# Patient Record
Sex: Female | Born: 1985 | Race: White | Hispanic: No | Marital: Married | Smoking: Current some day smoker
Health system: Southern US, Community
[De-identification: ages and names within clinical notes are randomized; demographics above are authoritative.]

## PROBLEM LIST (undated history)

## (undated) ENCOUNTER — Inpatient Hospital Stay (HOSPITAL_COMMUNITY): Payer: Self-pay

## (undated) DIAGNOSIS — R51 Headache: Secondary | ICD-10-CM

## (undated) DIAGNOSIS — E039 Hypothyroidism, unspecified: Secondary | ICD-10-CM

## (undated) DIAGNOSIS — I499 Cardiac arrhythmia, unspecified: Secondary | ICD-10-CM

## (undated) DIAGNOSIS — N83209 Unspecified ovarian cyst, unspecified side: Secondary | ICD-10-CM

## (undated) HISTORY — PX: DILATION AND CURETTAGE OF UTERUS: SHX78

## (undated) HISTORY — DX: Cardiac arrhythmia, unspecified: I49.9

## (undated) HISTORY — DX: Unspecified ovarian cyst, unspecified side: N83.209

## (undated) HISTORY — PX: TONSILLECTOMY: SUR1361

## (undated) HISTORY — DX: Hypothyroidism, unspecified: E03.9

## (undated) HISTORY — DX: Headache: R51

---

## 2006-06-29 ENCOUNTER — Encounter: Admission: RE | Admit: 2006-06-29 | Discharge: 2006-06-29 | Payer: Self-pay | Admitting: Orthopedic Surgery

## 2006-11-23 ENCOUNTER — Emergency Department (HOSPITAL_COMMUNITY): Admission: EM | Admit: 2006-11-23 | Discharge: 2006-11-23 | Payer: Self-pay | Admitting: Family Medicine

## 2010-06-15 ENCOUNTER — Ambulatory Visit (HOSPITAL_COMMUNITY): Admission: RE | Admit: 2010-06-15 | Discharge: 2010-06-15 | Payer: Self-pay | Admitting: Family Medicine

## 2012-06-24 LAB — OB RESULTS CONSOLE ANTIBODY SCREEN: Antibody Screen: POSITIVE

## 2012-06-24 LAB — OB RESULTS CONSOLE VARICELLA ZOSTER ANTIBODY, IGG: Varicella: IMMUNE

## 2012-06-24 LAB — OB RESULTS CONSOLE RUBELLA ANTIBODY, IGM: Rubella: IMMUNE

## 2012-06-24 LAB — OB RESULTS CONSOLE HEPATITIS B SURFACE ANTIGEN: Hepatitis B Surface Ag: NEGATIVE

## 2012-08-07 NOTE — L&D Delivery Note (Signed)
Delivery Note At 7:41 PM a viable female was delivered via Vaginal, Spontaneous Delivery (Presentation:oa ;  ).  APGAR:9 ,9 ; weight .   Placenta status:spont via shultz , .  Cord3 vc:  with the following complications:none .  Cord pH: n/a  Anesthesia: Epidural  Episiotomy: None Lacerations: 1st degree;Labial Suture Repair: n/a Est. Blood Loss (mL): 300  Mom to postpartum.  Baby to nursery-stable.  Wyvonnia Dusky DARLENE 02/23/2013, 7:58 PM

## 2012-08-07 NOTE — L&D Delivery Note (Signed)
Attestation of Attending Supervision of Advanced Practitioner (CNM/NP): Evaluation and management procedures were performed by the Advanced Practitioner under my supervision and collaboration.  I have reviewed the Advanced Practitioner's note and chart, and I agree with the management and plan.  Renesmae Donahey 02/27/2013 8:09 PM

## 2012-10-09 ENCOUNTER — Encounter: Payer: Self-pay | Admitting: *Deleted

## 2012-10-09 DIAGNOSIS — I499 Cardiac arrhythmia, unspecified: Secondary | ICD-10-CM

## 2012-10-09 DIAGNOSIS — G43909 Migraine, unspecified, not intractable, without status migrainosus: Secondary | ICD-10-CM | POA: Insufficient documentation

## 2012-10-09 DIAGNOSIS — E039 Hypothyroidism, unspecified: Secondary | ICD-10-CM | POA: Insufficient documentation

## 2012-10-22 ENCOUNTER — Ambulatory Visit (INDEPENDENT_AMBULATORY_CARE_PROVIDER_SITE_OTHER): Payer: Medicaid Other | Admitting: Advanced Practice Midwife

## 2012-10-22 ENCOUNTER — Encounter: Payer: Self-pay | Admitting: Advanced Practice Midwife

## 2012-10-22 ENCOUNTER — Other Ambulatory Visit: Payer: Self-pay | Admitting: Advanced Practice Midwife

## 2012-10-22 VITALS — BP 110/82 | Wt 260.0 lb

## 2012-10-22 DIAGNOSIS — F1920 Other psychoactive substance dependence, uncomplicated: Secondary | ICD-10-CM

## 2012-10-22 DIAGNOSIS — O09299 Supervision of pregnancy with other poor reproductive or obstetric history, unspecified trimester: Secondary | ICD-10-CM

## 2012-10-22 DIAGNOSIS — O99321 Drug use complicating pregnancy, first trimester: Secondary | ICD-10-CM

## 2012-10-22 DIAGNOSIS — Z3492 Encounter for supervision of normal pregnancy, unspecified, second trimester: Secondary | ICD-10-CM

## 2012-10-22 DIAGNOSIS — F1910 Other psychoactive substance abuse, uncomplicated: Secondary | ICD-10-CM

## 2012-10-22 DIAGNOSIS — O99320 Drug use complicating pregnancy, unspecified trimester: Secondary | ICD-10-CM

## 2012-10-22 DIAGNOSIS — Z331 Pregnant state, incidental: Secondary | ICD-10-CM

## 2012-10-22 DIAGNOSIS — Z1389 Encounter for screening for other disorder: Secondary | ICD-10-CM

## 2012-10-22 DIAGNOSIS — IMO0002 Reserved for concepts with insufficient information to code with codable children: Secondary | ICD-10-CM

## 2012-10-22 LAB — POCT URINALYSIS DIPSTICK: Glucose, UA: NEGATIVE

## 2012-10-22 NOTE — Progress Notes (Signed)
No c/o at this time.  Routine questions about pregnancy andswered.  F/U in 4 weeks for PNV and 2 hour gtt.

## 2012-10-22 NOTE — Progress Notes (Signed)
Swelling in hands and feet. 

## 2012-10-23 LAB — DRUG SCREEN, URINE, NO CONFIRMATION
Amphetamine Screen, Ur: NEGATIVE
Benzodiazepines.: NEGATIVE
Cocaine Metabolites: NEGATIVE
Marijuana Metabolite: NEGATIVE
Opiate Screen, Urine: NEGATIVE
Phencyclidine (PCP): NEGATIVE

## 2012-10-24 ENCOUNTER — Telehealth: Payer: Self-pay | Admitting: Obstetrics and Gynecology

## 2012-10-24 NOTE — Telephone Encounter (Signed)
Pt informed to push fluids and can take OTC Tylenol for the cramping. Pt told to call office back if no improvements. Pt verbalized understanding.

## 2012-11-21 ENCOUNTER — Other Ambulatory Visit: Payer: Medicaid Other

## 2012-11-21 ENCOUNTER — Encounter: Payer: Self-pay | Admitting: Advanced Practice Midwife

## 2012-11-21 ENCOUNTER — Ambulatory Visit (INDEPENDENT_AMBULATORY_CARE_PROVIDER_SITE_OTHER): Payer: Medicaid Other | Admitting: Advanced Practice Midwife

## 2012-11-21 VITALS — BP 110/80 | Wt 268.0 lb

## 2012-11-21 DIAGNOSIS — Z1389 Encounter for screening for other disorder: Secondary | ICD-10-CM

## 2012-11-21 DIAGNOSIS — IMO0002 Reserved for concepts with insufficient information to code with codable children: Secondary | ICD-10-CM

## 2012-11-21 DIAGNOSIS — Z3480 Encounter for supervision of other normal pregnancy, unspecified trimester: Secondary | ICD-10-CM | POA: Insufficient documentation

## 2012-11-21 DIAGNOSIS — Z3482 Encounter for supervision of other normal pregnancy, second trimester: Secondary | ICD-10-CM

## 2012-11-21 DIAGNOSIS — O09299 Supervision of pregnancy with other poor reproductive or obstetric history, unspecified trimester: Secondary | ICD-10-CM

## 2012-11-21 DIAGNOSIS — Z331 Pregnant state, incidental: Secondary | ICD-10-CM

## 2012-11-21 DIAGNOSIS — E079 Disorder of thyroid, unspecified: Secondary | ICD-10-CM

## 2012-11-21 DIAGNOSIS — E039 Hypothyroidism, unspecified: Secondary | ICD-10-CM

## 2012-11-21 DIAGNOSIS — F1920 Other psychoactive substance dependence, uncomplicated: Secondary | ICD-10-CM

## 2012-11-21 LAB — POCT URINALYSIS DIPSTICK
Leukocytes, UA: NEGATIVE
Nitrite, UA: NEGATIVE
Protein, UA: NEGATIVE

## 2012-11-21 LAB — CBC
HCT: 35 % — ABNORMAL LOW (ref 36.0–46.0)
Hemoglobin: 11.9 g/dL — ABNORMAL LOW (ref 12.0–15.0)
MCH: 31.1 pg (ref 26.0–34.0)
MCV: 91.4 fL (ref 78.0–100.0)
Platelets: 205 10*3/uL (ref 150–400)
RBC: 3.83 MIL/uL — ABNORMAL LOW (ref 3.87–5.11)
WBC: 8 10*3/uL (ref 4.0–10.5)

## 2012-11-21 NOTE — Progress Notes (Addendum)
Error

## 2012-11-21 NOTE — Progress Notes (Signed)
C/O URI symptoms for 3 days. OTC meds/ call for ABX if symptoms still present in 1 week.    No c/o at this time. Went to class.  May get Doula.   Routine questions about pregnancy answered.  F/U in 4 weeks for LROB.

## 2012-11-21 NOTE — Progress Notes (Signed)
Pain in the bottom of stomach.

## 2012-11-22 LAB — GLUCOSE TOLERANCE, 2 HOURS W/ 1HR
Glucose, 1 hour: 123 mg/dL (ref 70–170)
Glucose, 2 hour: 121 mg/dL (ref 70–139)
Glucose, Fasting: 90 mg/dL (ref 70–99)

## 2012-11-22 LAB — T.PALLIDUM AB, TOTAL: T pallidum Antibodies (TP-PA): 0.14 S/CO (ref <0.90)

## 2012-11-22 LAB — ANTIBODY SCREEN

## 2012-12-06 ENCOUNTER — Telehealth: Payer: Self-pay | Admitting: Obstetrics & Gynecology

## 2012-12-06 ENCOUNTER — Ambulatory Visit (INDEPENDENT_AMBULATORY_CARE_PROVIDER_SITE_OTHER): Payer: Medicaid Other | Admitting: Obstetrics & Gynecology

## 2012-12-06 ENCOUNTER — Encounter: Payer: Self-pay | Admitting: Obstetrics & Gynecology

## 2012-12-06 VITALS — BP 116/80 | Wt 267.5 lb

## 2012-12-06 DIAGNOSIS — Z331 Pregnant state, incidental: Secondary | ICD-10-CM

## 2012-12-06 DIAGNOSIS — Z1389 Encounter for screening for other disorder: Secondary | ICD-10-CM

## 2012-12-06 DIAGNOSIS — O99019 Anemia complicating pregnancy, unspecified trimester: Secondary | ICD-10-CM

## 2012-12-06 DIAGNOSIS — F1920 Other psychoactive substance dependence, uncomplicated: Secondary | ICD-10-CM

## 2012-12-06 DIAGNOSIS — O09299 Supervision of pregnancy with other poor reproductive or obstetric history, unspecified trimester: Secondary | ICD-10-CM

## 2012-12-06 DIAGNOSIS — O99280 Endocrine, nutritional and metabolic diseases complicating pregnancy, unspecified trimester: Secondary | ICD-10-CM

## 2012-12-06 DIAGNOSIS — O26899 Other specified pregnancy related conditions, unspecified trimester: Secondary | ICD-10-CM

## 2012-12-06 LAB — POCT URINALYSIS DIPSTICK
Glucose, UA: NEGATIVE
Ketones, UA: NEGATIVE
Leukocytes, UA: NEGATIVE
Nitrite, UA: NEGATIVE

## 2012-12-06 NOTE — Telephone Encounter (Signed)
Pt advised by Gigi Gin to come in to be seen at 12:30.

## 2012-12-06 NOTE — Progress Notes (Signed)
Cramps and pressure. Started Wednesday pm.

## 2012-12-06 NOTE — Progress Notes (Signed)
Called in complaining of intermittent cramping since Wednesday, no bleeding no gushes of fluid, not severe. Exam is negative with a long thick closed and firm cervix.  Pt reports someSome constipation, so either normal pregnancy discomfort/round ligament or GI Recommend miralax powder if constipation worsens

## 2012-12-19 ENCOUNTER — Encounter: Payer: Medicaid Other | Admitting: Advanced Practice Midwife

## 2012-12-23 ENCOUNTER — Ambulatory Visit (INDEPENDENT_AMBULATORY_CARE_PROVIDER_SITE_OTHER): Payer: Medicaid Other | Admitting: Women's Health

## 2012-12-23 ENCOUNTER — Encounter: Payer: Self-pay | Admitting: Women's Health

## 2012-12-23 VITALS — BP 110/80 | Wt 265.5 lb

## 2012-12-23 DIAGNOSIS — E079 Disorder of thyroid, unspecified: Secondary | ICD-10-CM

## 2012-12-23 DIAGNOSIS — O09299 Supervision of pregnancy with other poor reproductive or obstetric history, unspecified trimester: Secondary | ICD-10-CM

## 2012-12-23 DIAGNOSIS — F1920 Other psychoactive substance dependence, uncomplicated: Secondary | ICD-10-CM

## 2012-12-23 DIAGNOSIS — Z331 Pregnant state, incidental: Secondary | ICD-10-CM

## 2012-12-23 DIAGNOSIS — O99320 Drug use complicating pregnancy, unspecified trimester: Secondary | ICD-10-CM

## 2012-12-23 DIAGNOSIS — Z1389 Encounter for screening for other disorder: Secondary | ICD-10-CM

## 2012-12-23 DIAGNOSIS — O99280 Endocrine, nutritional and metabolic diseases complicating pregnancy, unspecified trimester: Secondary | ICD-10-CM

## 2012-12-23 DIAGNOSIS — O99322 Drug use complicating pregnancy, second trimester: Secondary | ICD-10-CM

## 2012-12-23 DIAGNOSIS — Z3483 Encounter for supervision of other normal pregnancy, third trimester: Secondary | ICD-10-CM

## 2012-12-23 DIAGNOSIS — E039 Hypothyroidism, unspecified: Secondary | ICD-10-CM

## 2012-12-23 DIAGNOSIS — O99019 Anemia complicating pregnancy, unspecified trimester: Secondary | ICD-10-CM

## 2012-12-23 LAB — POCT URINALYSIS DIPSTICK
Glucose, UA: NEGATIVE
Leukocytes, UA: NEGATIVE
Nitrite, UA: NEGATIVE

## 2012-12-23 MED ORDER — PRENATAL PLUS 27-1 MG PO TABS: 1.0000 | ORAL_TABLET | Freq: Every day | ORAL | Status: DC

## 2012-12-23 NOTE — Patient Instructions (Signed)
Pregnancy - Third Trimester  The third trimester of pregnancy (the last 3 months) is a period of the most rapid growth for you and your baby. The baby approaches a length of 20 inches and a weight of 6 to 10 pounds. The baby is adding on fat and getting ready for life outside your body. While inside, babies have periods of sleeping and waking, suck their thumbs, and hiccups. You can often feel small contractions of the uterus. This is false labor. It is also called Braxton-Hicks contractions. This is like a practice for labor. The usual problems in this stage of pregnancy include more difficulty breathing, swelling of the hands and feet from water retention, and having to urinate more often because of the uterus and baby pressing on your bladder.   PRENATAL EXAMS  · Blood work may continue to be done during prenatal exams. These tests are done to check on your health and the probable health of your baby. Blood work is used to follow your blood levels (hemoglobin). Anemia (low hemoglobin) is common during pregnancy. Iron and vitamins are given to help prevent this. You may also continue to be checked for diabetes. Some of the past blood tests may be done again.  · The size of the uterus is measured during each visit. This makes sure your baby is growing properly according to your pregnancy dates.  · Your blood pressure is checked every prenatal visit. This is to make sure you are not getting toxemia.  · Your urine is checked every prenatal visit for infection, diabetes and protein.  · Your weight is checked at each visit. This is done to make sure gains are happening at the suggested rate and that you and your baby are growing normally.  · Sometimes, an ultrasound is performed to confirm the position and the proper growth and development of the baby. This is a test done that bounces harmless sound waves off the baby so your caregiver can more accurately determine due dates.  · Discuss the type of pain medication and  anesthesia you will have during your labor and delivery.  · Discuss the possibility and anesthesia if a Cesarean Section might be necessary.  · Inform your caregiver if there is any mental or physical violence at home.  Sometimes, a specialized non-stress test, contraction stress test and biophysical profile are done to make sure the baby is not having a problem. Checking the amniotic fluid surrounding the baby is called an amniocentesis. The amniotic fluid is removed by sticking a needle into the belly (abdomen). This is sometimes done near the end of pregnancy if an early delivery is required. In this case, it is done to help make sure the baby's lungs are mature enough for the baby to live outside of the womb. If the lungs are not mature and it is unsafe to deliver the baby, an injection of cortisone medication is given to the mother 1 to 2 days before the delivery. This helps the baby's lungs mature and makes it safer to deliver the baby.  CHANGES OCCURING IN THE THIRD TRIMESTER OF PREGNANCY  Your body goes through many changes during pregnancy. They vary from person to person. Talk to your caregiver about changes you notice and are concerned about.  · During the last trimester, you have probably had an increase in your appetite. It is normal to have cravings for certain foods. This varies from person to person and pregnancy to pregnancy.  · You may begin to   get stretch marks on your hips, abdomen, and breasts. These are normal changes in the body during pregnancy. There are no exercises or medications to take which prevent this change.  · Constipation may be treated with a stool softener or adding bulk to your diet. Drinking lots of fluids, fiber in vegetables, fruits, and whole grains are helpful.  · Exercising is also helpful. If you have been very active up until your pregnancy, most of these activities can be continued during your pregnancy. If you have been less active, it is helpful to start an exercise  program such as walking. Consult your caregiver before starting exercise programs.  · Avoid all smoking, alcohol, un-prescribed drugs, herbs and "street drugs" during your pregnancy. These chemicals affect the formation and growth of the baby. Avoid chemicals throughout the pregnancy to ensure the delivery of a healthy infant.  · Backache, varicose veins and hemorrhoids may develop or get worse.  · You will tire more easily in the third trimester, which is normal.  · The baby's movements may be stronger and more often.  · You may become short of breath easily.  · Your belly button may stick out.  · A yellow discharge may leak from your breasts called colostrum.  · You may have a bloody mucus discharge. This usually occurs a few days to a week before labor begins.  HOME CARE INSTRUCTIONS   · Keep your caregiver's appointments. Follow your caregiver's instructions regarding medication use, exercise, and diet.  · During pregnancy, you are providing food for you and your baby. Continue to eat regular, well-balanced meals. Choose foods such as meat, fish, milk and other low fat dairy products, vegetables, fruits, and whole-grain breads and cereals. Your caregiver will tell you of the ideal weight gain.  · A physical sexual relationship may be continued throughout pregnancy if there are no other problems such as early (premature) leaking of amniotic fluid from the membranes, vaginal bleeding, or belly (abdominal) pain.  · Exercise regularly if there are no restrictions. Check with your caregiver if you are unsure of the safety of your exercises. Greater weight gain will occur in the last 2 trimesters of pregnancy. Exercising helps:  · Control your weight.  · Get you in shape for labor and delivery.  · You lose weight after you deliver.  · Rest a lot with legs elevated, or as needed for leg cramps or low back pain.  · Wear a good support or jogging bra for breast tenderness during pregnancy. This may help if worn during  sleep. Pads or tissues may be used in the bra if you are leaking colostrum.  · Do not use hot tubs, steam rooms, or saunas.  · Wear your seat belt when driving. This protects you and your baby if you are in an accident.  · Avoid raw meat, cat litter boxes and soil used by cats. These carry germs that can cause birth defects in the baby.  · It is easier to loose urine during pregnancy. Tightening up and strengthening the pelvic muscles will help with this problem. You can practice stopping your urination while you are going to the bathroom. These are the same muscles you need to strengthen. It is also the muscles you would use if you were trying to stop from passing gas. You can practice tightening these muscles up 10 times a set and repeating this about 3 times per day. Once you know what muscles to tighten up, do not perform these   exercises during urination. It is more likely to cause an infection by backing up the urine.  · Ask for help if you have financial, counseling or nutritional needs during pregnancy. Your caregiver will be able to offer counseling for these needs as well as refer you for other special needs.  · Make a list of emergency phone numbers and have them available.  · Plan on getting help from family or friends when you go home from the hospital.  · Make a trial run to the hospital.  · Take prenatal classes with the father to understand, practice and ask questions about the labor and delivery.  · Prepare the baby's room/nursery.  · Do not travel out of the city unless it is absolutely necessary and with the advice of your caregiver.  · Wear only low or no heal shoes to have better balance and prevent falling.  MEDICATIONS AND DRUG USE IN PREGNANCY  · Take prenatal vitamins as directed. The vitamin should contain 1 milligram of folic acid. Keep all vitamins out of reach of children. Only a couple vitamins or tablets containing iron may be fatal to a baby or young child when ingested.  · Avoid use  of all medications, including herbs, over-the-counter medications, not prescribed or suggested by your caregiver. Only take over-the-counter or prescription medicines for pain, discomfort, or fever as directed by your caregiver. Do not use aspirin, ibuprofen (Motrin®, Advil®, Nuprin®) or naproxen (Aleve®) unless OK'd by your caregiver.  · Let your caregiver also know about herbs you may be using.  · Alcohol is related to a number of birth defects. This includes fetal alcohol syndrome. All alcohol, in any form, should be avoided completely. Smoking will cause low birth rate and premature babies.  · Street/illegal drugs are very harmful to the baby. They are absolutely forbidden. A baby born to an addicted mother will be addicted at birth. The baby will go through the same withdrawal an adult does.  SEEK MEDICAL CARE IF:  You have any concerns or worries during your pregnancy. It is better to call with your questions if you feel they cannot wait, rather than worry about them.  DECISIONS ABOUT CIRCUMCISION  You may or may not know the sex of your baby. If you know your baby is a boy, it may be time to think about circumcision. Circumcision is the removal of the foreskin of the penis. This is the skin that covers the sensitive end of the penis. There is no proven medical need for this. Often this decision is made on what is popular at the time or based upon religious beliefs and social issues. You can discuss these issues with your caregiver or pediatrician.  SEEK IMMEDIATE MEDICAL CARE IF:   · An unexplained oral temperature above 102° F (38.9° C) develops, or as your caregiver suggests.  · You have leaking of fluid from the vagina (birth canal). If leaking membranes are suspected, take your temperature and tell your caregiver of this when you call.  · There is vaginal spotting, bleeding or passing clots. Tell your caregiver of the amount and how many pads are used.  · You develop a bad smelling vaginal discharge with  a change in the color from clear to white.  · You develop vomiting that lasts more than 24 hours.  · You develop chills or fever.  · You develop shortness of breath.  · You develop burning on urination.  · You loose more than 2 pounds of weight   or gain more than 2 pounds of weight or as suggested by your caregiver.  · You notice sudden swelling of your face, hands, and feet or legs.  · You develop belly (abdominal) pain. Round ligament discomfort is a common non-cancerous (benign) cause of abdominal pain in pregnancy. Your caregiver still must evaluate you.  · You develop a severe headache that does not go away.  · You develop visual problems, blurred or double vision.  · If you have not felt your baby move for more than 1 hour. If you think the baby is not moving as much as usual, eat something with sugar in it and lie down on your left side for an hour. The baby should move at least 4 to 5 times per hour. Call right away if your baby moves less than that.  · You fall, are in a car accident or any kind of trauma.  · There is mental or physical violence at home.  Document Released: 07/18/2001 Document Revised: 10/16/2011 Document Reviewed: 01/20/2009  [employer]® Patient Information ©2013 [employer].

## 2012-12-23 NOTE — Progress Notes (Signed)
Pain in lower part of stomach. Pain in left side when lifting left leg.

## 2012-12-24 NOTE — Progress Notes (Signed)
Reports good fm. Denies uc's, lof, vb, urinary frequency, urgency, hesitancy, or dysuria.  No complaints.  Interested in waterbirth- attended class on 4/15. All questions answered. F/u 2 wks for visit.

## 2013-01-01 ENCOUNTER — Encounter: Payer: Medicaid Other | Admitting: Obstetrics and Gynecology

## 2013-01-06 ENCOUNTER — Encounter: Payer: Self-pay | Admitting: Obstetrics & Gynecology

## 2013-01-06 ENCOUNTER — Ambulatory Visit (INDEPENDENT_AMBULATORY_CARE_PROVIDER_SITE_OTHER): Payer: Medicaid Other | Admitting: Obstetrics & Gynecology

## 2013-01-06 VITALS — BP 128/80 | Wt 272.0 lb

## 2013-01-06 DIAGNOSIS — F1920 Other psychoactive substance dependence, uncomplicated: Secondary | ICD-10-CM

## 2013-01-06 DIAGNOSIS — Z3483 Encounter for supervision of other normal pregnancy, third trimester: Secondary | ICD-10-CM

## 2013-01-06 DIAGNOSIS — O09299 Supervision of pregnancy with other poor reproductive or obstetric history, unspecified trimester: Secondary | ICD-10-CM

## 2013-01-06 DIAGNOSIS — Z1389 Encounter for screening for other disorder: Secondary | ICD-10-CM

## 2013-01-06 DIAGNOSIS — O99019 Anemia complicating pregnancy, unspecified trimester: Secondary | ICD-10-CM

## 2013-01-06 DIAGNOSIS — E079 Disorder of thyroid, unspecified: Secondary | ICD-10-CM

## 2013-01-06 DIAGNOSIS — Z331 Pregnant state, incidental: Secondary | ICD-10-CM

## 2013-01-06 LAB — POCT URINALYSIS DIPSTICK
Glucose, UA: NEGATIVE
Leukocytes, UA: NEGATIVE
Nitrite, UA: NEGATIVE
Protein, UA: NEGATIVE

## 2013-01-06 NOTE — Progress Notes (Signed)
BP weight and urine results all reviewed and noted. Patient reports good fetal movement, denies any bleeding and no rupture of membranes symptoms or regular contractions. Patient is without complaints. All questions were answered.  

## 2013-01-06 NOTE — Addendum Note (Signed)
Addended by: Richardson Chiquito on: 01/06/2013 03:51 PM   Modules accepted: Orders

## 2013-01-20 ENCOUNTER — Encounter: Payer: Self-pay | Admitting: Obstetrics & Gynecology

## 2013-01-20 ENCOUNTER — Ambulatory Visit (INDEPENDENT_AMBULATORY_CARE_PROVIDER_SITE_OTHER): Payer: Medicaid Other | Admitting: Obstetrics & Gynecology

## 2013-01-20 VITALS — BP 110/80 | Wt 273.0 lb

## 2013-01-20 DIAGNOSIS — Z1389 Encounter for screening for other disorder: Secondary | ICD-10-CM

## 2013-01-20 DIAGNOSIS — O99280 Endocrine, nutritional and metabolic diseases complicating pregnancy, unspecified trimester: Secondary | ICD-10-CM

## 2013-01-20 DIAGNOSIS — O99019 Anemia complicating pregnancy, unspecified trimester: Secondary | ICD-10-CM

## 2013-01-20 DIAGNOSIS — Z331 Pregnant state, incidental: Secondary | ICD-10-CM

## 2013-01-20 DIAGNOSIS — O09299 Supervision of pregnancy with other poor reproductive or obstetric history, unspecified trimester: Secondary | ICD-10-CM

## 2013-01-20 DIAGNOSIS — Z3483 Encounter for supervision of other normal pregnancy, third trimester: Secondary | ICD-10-CM

## 2013-01-20 DIAGNOSIS — F1920 Other psychoactive substance dependence, uncomplicated: Secondary | ICD-10-CM

## 2013-01-20 LAB — POCT URINALYSIS DIPSTICK
Ketones, UA: NEGATIVE
Leukocytes, UA: NEGATIVE
Nitrite, UA: NEGATIVE

## 2013-01-20 NOTE — Progress Notes (Signed)
BP weight and urine results all reviewed and noted. Patient reports good fetal movement, denies any bleeding and no rupture of membranes symptoms or regular contractions. Patient is without complaints. All questions were answered.  

## 2013-01-27 ENCOUNTER — Ambulatory Visit (INDEPENDENT_AMBULATORY_CARE_PROVIDER_SITE_OTHER): Payer: Medicaid Other | Admitting: Obstetrics and Gynecology

## 2013-01-27 VITALS — BP 120/80 | Wt 271.8 lb

## 2013-01-27 DIAGNOSIS — E079 Disorder of thyroid, unspecified: Secondary | ICD-10-CM

## 2013-01-27 DIAGNOSIS — Z3480 Encounter for supervision of other normal pregnancy, unspecified trimester: Secondary | ICD-10-CM

## 2013-01-27 DIAGNOSIS — O09299 Supervision of pregnancy with other poor reproductive or obstetric history, unspecified trimester: Secondary | ICD-10-CM

## 2013-01-27 DIAGNOSIS — F1920 Other psychoactive substance dependence, uncomplicated: Secondary | ICD-10-CM

## 2013-01-27 DIAGNOSIS — Z331 Pregnant state, incidental: Secondary | ICD-10-CM

## 2013-01-27 DIAGNOSIS — O99280 Endocrine, nutritional and metabolic diseases complicating pregnancy, unspecified trimester: Secondary | ICD-10-CM

## 2013-01-27 DIAGNOSIS — Z1389 Encounter for screening for other disorder: Secondary | ICD-10-CM

## 2013-01-27 DIAGNOSIS — O99019 Anemia complicating pregnancy, unspecified trimester: Secondary | ICD-10-CM

## 2013-01-27 LAB — POCT URINALYSIS DIPSTICK: Nitrite, UA: NEGATIVE

## 2013-01-27 LAB — OB RESULTS CONSOLE GC/CHLAMYDIA
Chlamydia: NEGATIVE
Gonorrhea: NEGATIVE

## 2013-01-27 NOTE — Patient Instructions (Signed)
Thank you for coming in today Please continue taking your prenatal vitamin Please return in 1 week or sooner if needed  Pregnancy - Third Trimester The third trimester of pregnancy (the last 3 months) is a period of the most rapid growth for you and your baby. The baby approaches a length of 20 inches and a weight of 6 to 10 pounds. The baby is adding on fat and getting ready for life outside your body. While inside, babies have periods of sleeping and waking, sucking thumbs, and hiccuping. You can often feel small contractions of the uterus. This is false labor. It is also called Braxton-Hicks contractions. This is like a practice for labor. The usual problems in this stage of pregnancy include more difficulty breathing, swelling of the hands and feet from water retention, and having to urinate more often because of the uterus and baby pressing on your bladder.  PRENATAL EXAMS  Blood work may continue to be done during prenatal exams. These tests are done to check on your health and the probable health of your baby. Blood work is used to follow your blood levels (hemoglobin). Anemia (low hemoglobin) is common during pregnancy. Iron and vitamins are given to help prevent this. You may also continue to be checked for diabetes. Some of the past blood tests may be done again.  The size of the uterus is measured during each visit. This makes sure your baby is growing properly according to your pregnancy dates.  Your blood pressure is checked every prenatal visit. This is to make sure you are not getting toxemia.  Your urine is checked every prenatal visit for infection, diabetes, and protein.  Your weight is checked at each visit. This is done to make sure gains are happening at the suggested rate and that you and your baby are growing normally.  Sometimes, an ultrasound is performed to confirm the position and the proper growth and development of the baby. This is a test done that bounces harmless  sound waves off the baby so your caregiver can more accurately determine a due date.  Discuss the type of pain medicine and anesthesia you will have during your labor and delivery.  Discuss the possibility and anesthesia if a cesarean section might be necessary.  Inform your caregiver if there is any mental or physical violence at home. Sometimes, a specialized non-stress test, contraction stress test, and biophysical profile are done to make sure the baby is not having a problem. Checking the amniotic fluid surrounding the baby is called an amniocentesis. The amniotic fluid is removed by sticking a needle into the belly (abdomen). This is sometimes done near the end of pregnancy if an early delivery is required. In this case, it is done to help make sure the baby's lungs are mature enough for the baby to live outside of the womb. If the lungs are not mature and it is unsafe to deliver the baby, an injection of cortisone medicine is given to the mother 1 to 2 days before the delivery. This helps the baby's lungs mature and makes it safer to deliver the baby. CHANGES OCCURING IN THE THIRD TRIMESTER OF PREGNANCY Your body goes through many changes during pregnancy. They vary from person to person. Talk to your caregiver about changes you notice and are concerned about.  During the last trimester, you have probably had an increase in your appetite. It is normal to have cravings for certain foods. This varies from person to person and pregnancy to  pregnancy.  You may begin to get stretch marks on your hips, abdomen, and breasts. These are normal changes in the body during pregnancy. There are no exercises or medicines to take which prevent this change.  Constipation may be treated with a stool softener or adding bulk to your diet. Drinking lots of fluids, fiber in vegetables, fruits, and whole grains are helpful.  Exercising is also helpful. If you have been very active up until your pregnancy, most of  these activities can be continued during your pregnancy. If you have been less active, it is helpful to start an exercise program such as walking. Consult your caregiver before starting exercise programs.  Avoid all smoking, alcohol, non-prescribed drugs, herbs and "street drugs" during your pregnancy. These chemicals affect the formation and growth of the baby. Avoid chemicals throughout the pregnancy to ensure the delivery of a healthy infant.  Backache, varicose veins, and hemorrhoids may develop or get worse.  You will tire more easily in the third trimester, which is normal.  The baby's movements may be stronger and more often.  You may become short of breath easily.  Your belly button may stick out.  A yellow discharge may leak from your breasts called colostrum.  You may have a bloody mucus discharge. This usually occurs a few days to a week before labor begins. HOME CARE INSTRUCTIONS   Keep your caregiver's appointments. Follow your caregiver's instructions regarding medicine use, exercise, and diet.  During pregnancy, you are providing food for you and your baby. Continue to eat regular, well-balanced meals. Choose foods such as meat, fish, milk and other low fat dairy products, vegetables, fruits, and whole-grain breads and cereals. Your caregiver will tell you of the ideal weight gain.  A physical sexual relationship may be continued throughout pregnancy if there are no other problems such as early (premature) leaking of amniotic fluid from the membranes, vaginal bleeding, or belly (abdominal) pain.  Exercise regularly if there are no restrictions. Check with your caregiver if you are unsure of the safety of your exercises. Greater weight gain will occur in the last 2 trimesters of pregnancy. Exercising helps:  Control your weight.  Get you in shape for labor and delivery.  You lose weight after you deliver.  Rest a lot with legs elevated, or as needed for leg cramps or  low back pain.  Wear a good support or jogging bra for breast tenderness during pregnancy. This may help if worn during sleep. Pads or tissues may be used in the bra if you are leaking colostrum.  Do not use hot tubs, steam rooms, or saunas.  Wear your seat belt when driving. This protects you and your baby if you are in an accident.  Avoid raw meat, cat litter boxes and soil used by cats. These carry germs that can cause birth defects in the baby.  It is easier to leak urine during pregnancy. Tightening up and strengthening the pelvic muscles will help with this problem. You can practice stopping your urination while you are going to the bathroom. These are the same muscles you need to strengthen. It is also the muscles you would use if you were trying to stop from passing gas. You can practice tightening these muscles up 10 times a set and repeating this about 3 times per day. Once you know what muscles to tighten up, do not perform these exercises during urination. It is more likely to cause an infection by backing up the urine.  Ask  for help if you have financial, counseling, or nutritional needs during pregnancy. Your caregiver will be able to offer counseling for these needs as well as refer you for other special needs.  Make a list of emergency phone numbers and have them available.  Plan on getting help from family or friends when you go home from the hospital.  Make a trial run to the hospital.  Take prenatal classes with the father to understand, practice, and ask questions about the labor and delivery.  Prepare the baby's room or nursery.  Do not travel out of the city unless it is absolutely necessary and with the advice of your caregiver.  Wear only low or no heal shoes to have better balance and prevent falling. MEDICINES AND DRUG USE IN PREGNANCY  Take prenatal vitamins as directed. The vitamin should contain 1 milligram of folic acid. Keep all vitamins out of reach of  children. Only a couple vitamins or tablets containing iron may be fatal to a baby or young child when ingested.  Avoid use of all medicines, including herbs, over-the-counter medicines, not prescribed or suggested by your caregiver. Only take over-the-counter or prescription medicines for pain, discomfort, or fever as directed by your caregiver. Do not use aspirin, ibuprofen or naproxen unless approved by your caregiver.  Let your caregiver also know about herbs you may be using.  Alcohol is related to a number of birth defects. This includes fetal alcohol syndrome. All alcohol, in any form, should be avoided completely. Smoking will cause low birth rate and premature babies.  Illegal drugs are very harmful to the baby. They are absolutely forbidden. A baby born to an addicted mother will be addicted at birth. The baby will go through the same withdrawal an adult does. SEEK MEDICAL CARE IF: You have any concerns or worries during your pregnancy. It is better to call with your questions if you feel they cannot wait, rather than worry about them. SEEK IMMEDIATE MEDICAL CARE IF:   An unexplained oral temperature above 102 F (38.9 C) develops, or as your caregiver suggests.  You have leaking of fluid from the vagina. If leaking membranes are suspected, take your temperature and tell your caregiver of this when you call.  There is vaginal spotting, bleeding or passing clots. Tell your caregiver of the amount and how many pads are used.  You develop a bad smelling vaginal discharge with a change in the color from clear to white.  You develop vomiting that lasts more than 24 hours.  You develop chills or fever.  You develop shortness of breath.  You develop burning on urination.  You loose more than 2 pounds of weight or gain more than 2 pounds of weight or as suggested by your caregiver.  You notice sudden swelling of your face, hands, and feet or legs.  You develop belly (abdominal)  pain. Round ligament discomfort is a common non-cancerous (benign) cause of abdominal pain in pregnancy. Your caregiver still must evaluate you.  You develop a severe headache that does not go away.  You develop visual problems, blurred or double vision.  If you have not felt your baby move for more than 1 hour. If you think the baby is not moving as much as usual, eat something with sugar in it and lie down on your left side for an hour. The baby should move at least 4 to 5 times per hour. Call right away if your baby moves less than that.  You fall,  are in a car accident, or any kind of trauma.  There is mental or physical violence at home. Document Released: 07/18/2001 Document Revised: 04/17/2012 Document Reviewed: 01/20/2009 [facility] Patient Information 2014 [place], [place].  Fetal Movement Counts Patient Name: __________________________________________________ Patient Due Date: ____________________ Performing a fetal movement count is highly recommended in high-risk pregnancies, but it is good for every pregnant woman to do. Your caregiver may ask you to start counting fetal movements at 28 weeks of the pregnancy. Fetal movements often increase:  After eating a full meal.  After physical activity.  After eating or drinking something sweet or cold.  At rest. Pay attention to when you feel the baby is most active. This will help you notice a pattern of your baby's sleep and wake cycles and what factors contribute to an increase in fetal movement. It is important to perform a fetal movement count at the same time each day when your baby is normally most active.  HOW TO COUNT FETAL MOVEMENTS 1. Find a quiet and comfortable area to sit or lie down on your left side. Lying on your left side provides the best blood and oxygen circulation to your baby. 2. Write down the day and time on a sheet of paper or in a journal. 3. Start counting kicks, flutters, swishes, rolls, or jabs in a 2 hour  period. You should feel at least 10 movements within 2 hours. 4. If you do not feel 10 movements in 2 hours, wait 2 3 hours and count again. Look for a change in the pattern or not enough counts in 2 hours. SEEK MEDICAL CARE IF:  You feel less than 10 counts in 2 hours, tried twice.  There is no movement in over an hour.  The pattern is changing or taking longer each day to reach 10 counts in 2 hours.  You feel the baby is not moving as he or she usually does. Date: ____________ Movements: ____________ Start time: ____________ Marie Murphy time: ____________  Date: ____________ Movements: ____________ Start time: ____________ Marie Murphy time: ____________ Date: ____________ Movements: ____________ Start time: ____________ Marie Murphy time: ____________ Date: ____________ Movements: ____________ Start time: ____________ Marie Murphy time: ____________ Date: ____________ Movements: ____________ Start time: ____________ Marie Murphy time: ____________ Date: ____________ Movements: ____________ Start time: ____________ Marie Murphy time: ____________ Date: ____________ Movements: ____________ Start time: ____________ Marie Murphy time: ____________ Date: ____________ Movements: ____________ Start time: ____________ Marie Murphy time: ____________  Date: ____________ Movements: ____________ Start time: ____________ Marie Murphy time: ____________ Date: ____________ Movements: ____________ Start time: ____________ Marie Murphy time: ____________ Date: ____________ Movements: ____________ Start time: ____________ Marie Murphy time: ____________ Date: ____________ Movements: ____________ Start time: ____________ Marie Murphy time: ____________ Date: ____________ Movements: ____________ Start time: ____________ Marie Murphy time: ____________ Date: ____________ Movements: ____________ Start time: ____________ Marie Murphy time: ____________ Date: ____________ Movements: ____________ Start time: ____________ Marie Murphy time: ____________  Date: ____________ Movements: ____________  Start time: ____________ Marie Murphy time: ____________ Date: ____________ Movements: ____________ Start time: ____________ Marie Murphy time: ____________ Date: ____________ Movements: ____________ Start time: ____________ Marie Murphy time: ____________ Date: ____________ Movements: ____________ Start time: ____________ Marie Murphy time: ____________ Date: ____________ Movements: ____________ Start time: ____________ Marie Murphy time: ____________ Date: ____________ Movements: ____________ Start time: ____________ Marie Murphy time: ____________ Date: ____________ Movements: ____________ Start time: ____________ Marie Murphy time: ____________  Date: ____________ Movements: ____________ Start time: ____________ Marie Murphy time: ____________ Date: ____________ Movements: ____________ Start time: ____________ Marie Murphy time: ____________ Date: ____________ Movements: ____________ Start time: ____________ Marie Murphy time: ____________ Date: ____________ Movements: ____________ Start time: ____________ Marie Murphy time: ____________ Date: ____________ Movements: ____________  Start time: ____________ Marie Murphy time: ____________ Date: ____________ Movements: ____________ Start time: ____________ Marie Murphy time: ____________ Date: ____________ Movements: ____________ Start time: ____________ Marie Murphy time: ____________  Date: ____________ Movements: ____________ Start time: ____________ Marie Murphy time: ____________ Date: ____________ Movements: ____________ Start time: ____________ Marie Murphy time: ____________ Date: ____________ Movements: ____________ Start time: ____________ Marie Murphy time: ____________ Date: ____________ Movements: ____________ Start time: ____________ Marie Murphy time: ____________ Date: ____________ Movements: ____________ Start time: ____________ Marie Murphy time: ____________ Date: ____________ Movements: ____________ Start time: ____________ Marie Murphy time: ____________ Date: ____________ Movements: ____________ Start time: ____________ Marie Murphy time:  ____________  Date: ____________ Movements: ____________ Start time: ____________ Marie Murphy time: ____________ Date: ____________ Movements: ____________ Start time: ____________ Marie Murphy time: ____________ Date: ____________ Movements: ____________ Start time: ____________ Marie Murphy time: ____________ Date: ____________ Movements: ____________ Start time: ____________ Marie Murphy time: ____________ Date: ____________ Movements: ____________ Start time: ____________ Marie Murphy time: ____________ Date: ____________ Movements: ____________ Start time: ____________ Marie Murphy time: ____________ Date: ____________ Movements: ____________ Start time: ____________ Marie Murphy time: ____________  Date: ____________ Movements: ____________ Start time: ____________ Marie Murphy time: ____________ Date: ____________ Movements: ____________ Start time: ____________ Marie Murphy time: ____________ Date: ____________ Movements: ____________ Start time: ____________ Marie Murphy time: ____________ Date: ____________ Movements: ____________ Start time: ____________ Marie Murphy time: ____________ Date: ____________ Movements: ____________ Start time: ____________ Marie Murphy time: ____________ Date: ____________ Movements: ____________ Start time: ____________ Marie Murphy time: ____________ Date: ____________ Movements: ____________ Start time: ____________ Marie Murphy time: ____________  Date: ____________ Movements: ____________ Start time: ____________ Marie Murphy time: ____________ Date: ____________ Movements: ____________ Start time: ____________ Marie Murphy time: ____________ Date: ____________ Movements: ____________ Start time: ____________ Marie Murphy time: ____________ Date: ____________ Movements: ____________ Start time: ____________ Marie Murphy time: ____________ Date: ____________ Movements: ____________ Start time: ____________ Marie Murphy time: ____________ Date: ____________ Movements: ____________ Start time: ____________ Marie Murphy time: ____________ Document Released: 08/23/2006  Document Revised: 07/10/2012 Document Reviewed: 05/20/2012 [employer] Patient Information 2014 [employer].

## 2013-01-27 NOTE — Progress Notes (Signed)
No complaints Desiring water birth. Lengthy discussion had w/ pt and Dr. Emelda Fear regarding this method. GBS, GC, Chl done Return in 1 wk Cont PNV Labor precautions reviewed 3rd trimester and kick count handouts given

## 2013-01-27 NOTE — Progress Notes (Signed)
Pressure in vaginal area. GBS, GC/CHL today.

## 2013-01-28 NOTE — Progress Notes (Signed)
I was present and actively participating in pt visit as described in Dr Frances Nickels documentation

## 2013-01-29 LAB — STREP B DNA PROBE: GBSP: NEGATIVE

## 2013-02-03 ENCOUNTER — Ambulatory Visit (INDEPENDENT_AMBULATORY_CARE_PROVIDER_SITE_OTHER): Payer: Medicaid Other | Admitting: Obstetrics & Gynecology

## 2013-02-03 ENCOUNTER — Encounter: Payer: Self-pay | Admitting: Obstetrics & Gynecology

## 2013-02-03 VITALS — BP 120/80 | Wt 275.0 lb

## 2013-02-03 DIAGNOSIS — Z3483 Encounter for supervision of other normal pregnancy, third trimester: Secondary | ICD-10-CM

## 2013-02-03 DIAGNOSIS — O09299 Supervision of pregnancy with other poor reproductive or obstetric history, unspecified trimester: Secondary | ICD-10-CM

## 2013-02-03 DIAGNOSIS — O99019 Anemia complicating pregnancy, unspecified trimester: Secondary | ICD-10-CM

## 2013-02-03 DIAGNOSIS — E079 Disorder of thyroid, unspecified: Secondary | ICD-10-CM

## 2013-02-03 DIAGNOSIS — Z1389 Encounter for screening for other disorder: Secondary | ICD-10-CM

## 2013-02-03 DIAGNOSIS — Z331 Pregnant state, incidental: Secondary | ICD-10-CM

## 2013-02-03 DIAGNOSIS — F1920 Other psychoactive substance dependence, uncomplicated: Secondary | ICD-10-CM

## 2013-02-03 LAB — POCT URINALYSIS DIPSTICK
Glucose, UA: NEGATIVE
Ketones, UA: NEGATIVE
Leukocytes, UA: NEGATIVE
Nitrite, UA: NEGATIVE

## 2013-02-03 NOTE — Patient Instructions (Signed)

## 2013-02-03 NOTE — Progress Notes (Signed)
BP weight and urine results all reviewed and noted. Patient reports good fetal movement, denies any bleeding and no rupture of membranes symptoms or regular contractions. Patient is without complaints. All questions were answered.  

## 2013-02-10 ENCOUNTER — Ambulatory Visit (INDEPENDENT_AMBULATORY_CARE_PROVIDER_SITE_OTHER): Payer: Medicaid Other | Admitting: Women's Health

## 2013-02-10 ENCOUNTER — Encounter: Payer: Self-pay | Admitting: Women's Health

## 2013-02-10 VITALS — Wt 278.0 lb

## 2013-02-10 DIAGNOSIS — E669 Obesity, unspecified: Secondary | ICD-10-CM

## 2013-02-10 DIAGNOSIS — E079 Disorder of thyroid, unspecified: Secondary | ICD-10-CM

## 2013-02-10 DIAGNOSIS — Z1389 Encounter for screening for other disorder: Secondary | ICD-10-CM

## 2013-02-10 DIAGNOSIS — Z331 Pregnant state, incidental: Secondary | ICD-10-CM

## 2013-02-10 DIAGNOSIS — E039 Hypothyroidism, unspecified: Secondary | ICD-10-CM

## 2013-02-10 DIAGNOSIS — Z3483 Encounter for supervision of other normal pregnancy, third trimester: Secondary | ICD-10-CM

## 2013-02-10 LAB — POCT URINALYSIS DIPSTICK
Glucose, UA: NEGATIVE
Nitrite, UA: NEGATIVE

## 2013-02-10 NOTE — Progress Notes (Signed)
Pt here today for routine visit. Pt states she went [facility] Saturday for pain and contractions. Pt states she is having some pressure in lower pelvic area. Pt denies any other issues at this time.

## 2013-02-10 NOTE — Progress Notes (Signed)
Reports good fm. Denies regular uc's, lof, vb, urinary frequency, urgency, hesitancy, or dysuria.  No complaints.  Reviewed labor s/s, fetal kick counts. Desires waterbirth, has gone to class and gotten liner- is getting kit this week.   All questions answered. F/U in 1wk for visit.

## 2013-02-10 NOTE — Patient Instructions (Signed)

## 2013-02-11 LAB — TSH: TSH: 2.445 u[IU]/mL (ref 0.350–4.500)

## 2013-02-13 ENCOUNTER — Encounter: Payer: Self-pay | Admitting: Women's Health

## 2013-02-17 ENCOUNTER — Ambulatory Visit (INDEPENDENT_AMBULATORY_CARE_PROVIDER_SITE_OTHER): Payer: Medicaid Other | Admitting: Women's Health

## 2013-02-17 ENCOUNTER — Encounter: Payer: Self-pay | Admitting: Women's Health

## 2013-02-17 VITALS — BP 130/90 | Wt 281.0 lb

## 2013-02-17 DIAGNOSIS — O163 Unspecified maternal hypertension, third trimester: Secondary | ICD-10-CM

## 2013-02-17 DIAGNOSIS — Z1389 Encounter for screening for other disorder: Secondary | ICD-10-CM

## 2013-02-17 DIAGNOSIS — O139 Gestational [pregnancy-induced] hypertension without significant proteinuria, unspecified trimester: Secondary | ICD-10-CM

## 2013-02-17 DIAGNOSIS — O09299 Supervision of pregnancy with other poor reproductive or obstetric history, unspecified trimester: Secondary | ICD-10-CM

## 2013-02-17 DIAGNOSIS — E669 Obesity, unspecified: Secondary | ICD-10-CM

## 2013-02-17 DIAGNOSIS — O99280 Endocrine, nutritional and metabolic diseases complicating pregnancy, unspecified trimester: Secondary | ICD-10-CM

## 2013-02-17 DIAGNOSIS — Z3483 Encounter for supervision of other normal pregnancy, third trimester: Secondary | ICD-10-CM

## 2013-02-17 DIAGNOSIS — O99210 Obesity complicating pregnancy, unspecified trimester: Secondary | ICD-10-CM

## 2013-02-17 DIAGNOSIS — O99019 Anemia complicating pregnancy, unspecified trimester: Secondary | ICD-10-CM

## 2013-02-17 DIAGNOSIS — Z331 Pregnant state, incidental: Secondary | ICD-10-CM

## 2013-02-17 LAB — POCT URINALYSIS DIPSTICK
Leukocytes, UA: NEGATIVE
Nitrite, UA: NEGATIVE
Protein, UA: NEGATIVE

## 2013-02-17 NOTE — Progress Notes (Signed)
Reports good fm. Denies uc's, lof, vb, urinary frequency, urgency, hesitancy, or dysuria.  Denies scotomata, ruq/epigastric pain. Has had ha's throughout pregnancy- no change. Has begun having nausea w/ few episodes vomiting.  Will get cbc, cmp, P:C ratio today. Reviewed labor s/s, pre-e s/s, fetal kick counts.  All questions answered. F/U Wed for bp check/visit.

## 2013-02-17 NOTE — Patient Instructions (Addendum)
Call the office or go to [facility] if:  You begin to have strong, frequent contractions  Your water breaks.  Sometimes it is a big gush of fluid, sometimes it is just a trickle that keeps getting your panties wet or running down your legs  You have vaginal bleeding.  It is normal to have a small amount of spotting if your cervix was checked.   You don't feel your baby moving like normal.  If you don't, get you something to eat and drink and lay down and focus on feeling your baby move.  You should feel at least 10 movements in 2 hours.  If you don't, you should call the office or go to [facility].   Preeclampsia and Eclampsia Preeclampsia is a condition of high blood pressure during pregnancy. It can happen at 20 weeks or later in pregnancy. If high blood pressure occurs in the second half of pregnancy with no other symptoms, it is called gestational hypertension and goes away after the baby is born. If any of the symptoms listed below develop with gestational hypertension, it is then called preeclampsia. Eclampsia (convulsions) may follow preeclampsia. This is one of the reasons for regular prenatal checkups. Early diagnosis and treatment are very important to prevent eclampsia. CAUSES  There is no known cause of preeclampsia/eclampsia in pregnancy. There are several known conditions that may put the pregnant woman at risk, such as:  The first pregnancy.  Having preeclampsia in a past pregnancy.  Having lasting (chronic) high blood pressure.  Having multiples (twins, triplets).  Being age 29 or older.  African American ethnic background.  Having kidney disease or diabetes.  Medical conditions such as lupus or blood diseases.  Being overweight (obese). SYMPTOMS   High blood pressure.  Headaches.  Sudden weight gain.  Swelling of hands, face, legs, and feet.  Protein in the urine.  Feeling sick to your stomach (nauseous) and throwing up (vomiting).  Vision  problems (blurred or double vision).  Numbness in the face, arms, legs, and feet.  Dizziness.  Slurred speech.  Preeclampsia can cause growth retardation in the fetus.  Separation (abruption) of the placenta.  Not enough fluid in the amniotic sac (oligohydramnios).  Sensitivity to bright lights.  Belly (abdominal) pain. DIAGNOSIS  If protein is found in the urine in the second half of pregnancy, this is considered preeclampsia. Other symptoms mentioned above may also be present. TREATMENT  It is necessary to treat this.  Your caregiver may prescribe bed rest early in this condition. Plenty of rest and salt restriction may be all that is needed.  Medicines may be necessary to lower blood pressure if the condition does not respond to more conservative measures.  In more severe cases, hospitalization may be needed:  For treatment of blood pressure.  To control fluid retention.  To monitor the baby to see if the condition is causing harm to the baby.  Hospitalization is the best way to treat the first sign of preeclampsia. This is so the mother and baby can be watched closely and blood tests can be done effectively and correctly.  If the condition becomes severe, it may be necessary to induce labor or to remove the infant by surgical means (cesarean section). The best cure for preeclampsia/eclampsia is to deliver the baby. Preeclampsia and eclampsia involve risks to mother and infant. Your caregiver will discuss these risks with you. Together, you can work out the best possible approach to your problems. Make sure you keep  your prenatal visits as scheduled. Not keeping appointments could result in a chronic or permanent injury, pain, disability to you, and death or injury to you or your unborn baby. If there is any problem keeping the appointment, you must call to reschedule. HOME CARE INSTRUCTIONS   Keep your prenatal appointments and tests as scheduled.  Tell your caregiver if  you have any of the above risk factors.  Get plenty of rest and sleep.  Eat a balanced diet that is low in salt, and do not add salt to your food.  Avoid stressful situations.  Only take over-the-counter and prescriptions medicines for pain, discomfort, or fever as directed by your caregiver. SEEK IMMEDIATE MEDICAL CARE IF:   You develop severe swelling anywhere in the body. This usually occurs in the legs.  You gain 5 lb/2.3 kg or more in a week.  You develop a severe headache, dizziness, problems with your vision, or confusion.  You have abdominal pain, nausea, or vomiting.  You have a seizure.  You have trouble moving any part of your body, or you develop numbness or problems speaking.  You have bruising or abnormal bleeding from anywhere in the body.  You develop a stiff neck.  You pass out. MAKE SURE YOU:   Understand these instructions.  Will watch your condition.  Will get help right away if you are not doing well or get worse. Document Released: 07/21/2000 Document Revised: 10/16/2011 Document Reviewed: 03/06/2008 [facility] Patient Information 2014 [place], [place].

## 2013-02-18 LAB — COMPREHENSIVE METABOLIC PANEL
AST: 13 U/L (ref 0–37)
Albumin: 3.3 g/dL — ABNORMAL LOW (ref 3.5–5.2)
Alkaline Phosphatase: 109 U/L (ref 39–117)
BUN: 9 mg/dL (ref 6–23)
Potassium: 4 mEq/L (ref 3.5–5.3)
Sodium: 134 mEq/L — ABNORMAL LOW (ref 135–145)
Total Protein: 6.4 g/dL (ref 6.0–8.3)

## 2013-02-18 LAB — CBC
MCHC: 34 g/dL (ref 30.0–36.0)
RDW: 14.9 % (ref 11.5–15.5)

## 2013-02-18 LAB — PROTEIN / CREATININE RATIO, URINE: Protein Creatinine Ratio: 0.08 (ref <0.15)

## 2013-02-19 ENCOUNTER — Ambulatory Visit (INDEPENDENT_AMBULATORY_CARE_PROVIDER_SITE_OTHER): Payer: Medicaid Other | Admitting: Obstetrics & Gynecology

## 2013-02-19 ENCOUNTER — Encounter: Payer: Self-pay | Admitting: Obstetrics & Gynecology

## 2013-02-19 VITALS — BP 118/80 | Wt 281.0 lb

## 2013-02-19 DIAGNOSIS — O99280 Endocrine, nutritional and metabolic diseases complicating pregnancy, unspecified trimester: Secondary | ICD-10-CM

## 2013-02-19 DIAGNOSIS — O139 Gestational [pregnancy-induced] hypertension without significant proteinuria, unspecified trimester: Secondary | ICD-10-CM

## 2013-02-19 DIAGNOSIS — Z331 Pregnant state, incidental: Secondary | ICD-10-CM

## 2013-02-19 DIAGNOSIS — O09299 Supervision of pregnancy with other poor reproductive or obstetric history, unspecified trimester: Secondary | ICD-10-CM

## 2013-02-19 DIAGNOSIS — E669 Obesity, unspecified: Secondary | ICD-10-CM

## 2013-02-19 DIAGNOSIS — Z1389 Encounter for screening for other disorder: Secondary | ICD-10-CM

## 2013-02-19 DIAGNOSIS — O99210 Obesity complicating pregnancy, unspecified trimester: Secondary | ICD-10-CM

## 2013-02-19 DIAGNOSIS — O99019 Anemia complicating pregnancy, unspecified trimester: Secondary | ICD-10-CM

## 2013-02-19 LAB — POCT URINALYSIS DIPSTICK
Blood, UA: NEGATIVE
Glucose, UA: NEGATIVE
Nitrite, UA: NEGATIVE

## 2013-02-19 NOTE — Patient Instructions (Signed)
Labor Induction  Most women go into labor on their own between 37 and 42 weeks of the pregnancy. When this does not happen or when there is a medical need, medicine or other methods may be used to induce labor. Labor induction causes a pregnant woman's uterus to contract. It also causes the cervix to soften (ripen), open (dilate), and thin out (efface). Usually, labor is not induced before 39 weeks of the pregnancy unless there is a problem with the baby or mother. Whether your labor will be induced depends on a number of factors, including the following:  The medical condition of you and the baby.  How many weeks along you are.  The status of baby's lung maturity.  The condition of the cervix.  The position of the baby. REASONS FOR LABOR INDUCTION  The health of the baby or mother is at risk.  The pregnancy is overdue by 1 week or more.  The water breaks but labor does not start on its own.  The mother has a health condition or serious illness such as high blood pressure, infection, placental abruption, or diabetes.  The amniotic fluid amounts are low around the baby.  The baby is distressed. REASONS TO NOT INDUCE LABOR Labor induction may not be a good idea if:  It is shown that your baby does not tolerate labor.  An induction is just more convenient.  You want the baby to be born on a certain date, like a holiday.  You have had previous surgeries on your uterus, such as a myomectomy or the removal of fibroids.  Your placenta lies very low in the uterus and blocks the opening of the cervix (placenta previa).  Your baby is not in a head down position.  The umbilical cord drops down into the birth canal in front of the baby. This could cut off the baby's blood and oxygen supply.  You have had a previous cesarean delivery.  There areunusual circumstances, such as the baby being extremely premature. RISKS AND COMPLICATIONS Problems may occur in the process of induction  and plans may need to be modified as a situation unfolds. Some of the risks of induction include:  Change in fetal heart rate, such as too high, too low, or erratic.  Risk of fetal distress.  Risk of infection to mother and baby.  Increased chance of having a cesarean delivery.  The rare, but increased chance that the placenta will separate from the uterus (abruption).  Uterine rupture (very rare). When induction is needed for medical reasons, the benefits of induction may outweigh the risks. BEFORE THE PROCEDURE Your caregiver will check your cervix and the baby's position. This will help your caregiver decide if you are far enough along for an induction to work. PROCEDURE Several methods of labor induction may be used, such as:   Taking prostaglandin medicine to dilate and ripen the cervix. The medicine will also start contractions. It can be taken by mouth or by inserting a suppository into the vagina.  A thin tube (catheter) with a balloon on the end may be inserted into your vagina to dilate the cervix. Once inserted, the balloon expands with water, which causes the cervix to open.  Striping the membranes. Your caregiver inserts a finger between the cervix and membranes, which causes the cervix to be stretched and may cause the uterus to contract. This is often done during an office visit. You will be sent home to wait for the contractions to begin. You will   then come in for an induction.  Breaking the water. Your caregiver will make a hole in the amniotic sac using a small instrument. Once the amniotic sac breaks, contractions should begin. This may still take hours to see an effect.  Taking medicine to trigger or strengthen contractions. This medicine is given intravenously through a tube in your arm. All of the methods of induction, besides stripping the membranes, will be done in the hospital. Induction is done in the hospital so that you and the baby can be carefully  monitored. AFTER THE PROCEDURE Some inductions can take up to 2 or 3 days. Depending on the cervix, it usually takes less time. It takes longer when you are induced early in the pregnancy or if this is your first pregnancy. If a mother is still pregnant and the induction has been going on for 2 to 3 days, either the mother will be sent home or a cesarean delivery will be needed. Document Released: 12/13/2006 Document Revised: 10/16/2011 Document Reviewed: 05/29/2011 [employer] Patient Information 2014 [employer].  

## 2013-02-19 NOTE — Progress Notes (Signed)
BP good today all labs normal  cx LTC Induction is set up for Tuesday 02/25/2013 Patient aware of cervical ripening etc for induction

## 2013-02-21 ENCOUNTER — Telehealth (HOSPITAL_COMMUNITY): Payer: Self-pay | Admitting: *Deleted

## 2013-02-21 ENCOUNTER — Telehealth: Payer: Self-pay | Admitting: Obstetrics & Gynecology

## 2013-02-21 ENCOUNTER — Encounter (HOSPITAL_COMMUNITY): Payer: Self-pay | Admitting: *Deleted

## 2013-02-21 MED ORDER — ZOLPIDEM TARTRATE 10 MG PO TABS: 10.0000 mg | ORAL_TABLET | Freq: Every evening | ORAL | Status: DC | PRN

## 2013-02-21 NOTE — Telephone Encounter (Signed)
Spoke with pt. Ambien e prescribed. JSY

## 2013-02-21 NOTE — Telephone Encounter (Signed)
Remus Loffler e prescribed

## 2013-02-21 NOTE — Telephone Encounter (Signed)
Routed to Dr. Despina Hidden.

## 2013-02-21 NOTE — Telephone Encounter (Signed)
Preadmission screen Pt states she is having contractions.  Upon further questions they are irregular ranging from a few 4-5 minutes apart then can go a couple of hours without them.  Enc pt to time them and come to hospital if they are 4-5 minutes apart for 1-2 hours, any bleeding, SROM or decreased fetal movement.  Instructed pt to let office know of events.

## 2013-02-23 ENCOUNTER — Encounter (HOSPITAL_COMMUNITY): Payer: Self-pay | Admitting: *Deleted

## 2013-02-23 ENCOUNTER — Encounter (HOSPITAL_COMMUNITY): Payer: Self-pay | Admitting: Anesthesiology

## 2013-02-23 ENCOUNTER — Inpatient Hospital Stay (HOSPITAL_COMMUNITY)
Admission: AD | Admit: 2013-02-23 | Discharge: 2013-02-25 | DRG: 775 | Disposition: A | Discharge status: Home / Self Care | Payer: Medicaid Other | Source: Ambulatory Visit | Attending: Obstetrics and Gynecology | Admitting: Obstetrics and Gynecology

## 2013-02-23 ENCOUNTER — Inpatient Hospital Stay (HOSPITAL_COMMUNITY): Payer: Medicaid Other | Admitting: Anesthesiology

## 2013-02-23 DIAGNOSIS — E079 Disorder of thyroid, unspecified: Secondary | ICD-10-CM

## 2013-02-23 DIAGNOSIS — O99284 Endocrine, nutritional and metabolic diseases complicating childbirth: Secondary | ICD-10-CM | POA: Diagnosis present

## 2013-02-23 DIAGNOSIS — E039 Hypothyroidism, unspecified: Secondary | ICD-10-CM | POA: Diagnosis present

## 2013-02-23 DIAGNOSIS — F1910 Other psychoactive substance abuse, uncomplicated: Secondary | ICD-10-CM

## 2013-02-23 DIAGNOSIS — IMO0001 Reserved for inherently not codable concepts without codable children: Secondary | ICD-10-CM

## 2013-02-23 DIAGNOSIS — O99322 Drug use complicating pregnancy, second trimester: Secondary | ICD-10-CM

## 2013-02-23 LAB — COMPREHENSIVE METABOLIC PANEL
ALT: 10 U/L (ref 0–35)
BUN: 12 mg/dL (ref 6–23)
CO2: 22 mEq/L (ref 19–32)
Calcium: 9.8 mg/dL (ref 8.4–10.5)
Creatinine, Ser: 0.73 mg/dL (ref 0.50–1.10)
GFR calc Af Amer: >90 mL/min (ref >90)
GFR calc non Af Amer: >90 mL/min (ref >90)
Glucose, Bld: 91 mg/dL (ref 70–99)
Sodium: 135 mEq/L (ref 135–145)
Total Protein: 6.8 g/dL (ref 6.0–8.3)

## 2013-02-23 LAB — CBC
HCT: 38.6 % (ref 36.0–46.0)
Hemoglobin: 13.6 g/dL (ref 12.0–15.0)
MCHC: 35.2 g/dL (ref 30.0–36.0)
RBC: 4.33 MIL/uL (ref 3.87–5.11)

## 2013-02-23 LAB — RPR TITER: RPR Titer: 1:2 {titer} — AB

## 2013-02-23 LAB — PROTEIN / CREATININE RATIO, URINE
Creatinine, Urine: 196.25 mg/dL
Protein Creatinine Ratio: 0.19 — ABNORMAL HIGH (ref 0.00–0.15)

## 2013-02-23 MED ORDER — HYDROXYZINE HCL 50 MG PO TABS: 50.0000 mg | ORAL_TABLET | Freq: Four times a day (QID) | ORAL | Status: DC | PRN

## 2013-02-23 MED ORDER — FENTANYL 2.5 MCG/ML BUPIVACAINE 1/10 % EPIDURAL INFUSION (WH - ANES)
INTRAMUSCULAR | Status: DC | PRN
  Administered 2013-02-23: 14 mL/h via EPIDURAL

## 2013-02-23 MED ORDER — FLEET ENEMA 7-19 GM/118ML RE ENEM: 1.0000 | ENEMA | RECTAL | Status: DC | PRN

## 2013-02-23 MED ORDER — PHENYLEPHRINE 40 MCG/ML (10ML) SYRINGE FOR IV PUSH (FOR BLOOD PRESSURE SUPPORT): 80.0000 ug | PREFILLED_SYRINGE | INTRAVENOUS | Status: DC | PRN

## 2013-02-23 MED ORDER — CITRIC ACID-SODIUM CITRATE 334-500 MG/5ML PO SOLN: 30.0000 mL | ORAL | Status: DC | PRN

## 2013-02-23 MED ORDER — DIBUCAINE 1 % RE OINT: 1.0000 "application " | TOPICAL_OINTMENT | RECTAL | Status: DC | PRN

## 2013-02-23 MED ORDER — WITCH HAZEL-GLYCERIN EX PADS: 1.0000 "application " | MEDICATED_PAD | CUTANEOUS | Status: DC | PRN

## 2013-02-23 MED ORDER — LIDOCAINE HCL (PF) 1 % IJ SOLN
INTRAMUSCULAR | Status: DC | PRN
  Administered 2013-02-23 (×2): 4 mL

## 2013-02-23 MED ORDER — ACETAMINOPHEN 325 MG PO TABS: 650.0000 mg | ORAL_TABLET | ORAL | Status: DC | PRN

## 2013-02-23 MED ORDER — FENTANYL 2.5 MCG/ML BUPIVACAINE 1/10 % EPIDURAL INFUSION (WH - ANES)
14.0000 mL/h | INTRAMUSCULAR | Status: DC | PRN
  Administered 2013-02-23: 14 mL/h via EPIDURAL
  Filled 2013-02-23 (×2): qty 125

## 2013-02-23 MED ORDER — OXYTOCIN 40 UNITS IN LACTATED RINGERS INFUSION - SIMPLE MED
62.5000 mL/h | INTRAVENOUS | Status: DC
  Filled 2013-02-23: qty 1000

## 2013-02-23 MED ORDER — DIPHENHYDRAMINE HCL 50 MG/ML IJ SOLN: 12.5000 mg | INTRAMUSCULAR | Status: DC | PRN

## 2013-02-23 MED ORDER — PRENATAL MULTIVITAMIN CH
1.0000 | ORAL_TABLET | Freq: Every day | ORAL | Status: DC
  Administered 2013-02-24 – 2013-02-25 (×2): 1 via ORAL
  Filled 2013-02-23 (×2): qty 1

## 2013-02-23 MED ORDER — LACTATED RINGERS IV SOLN: 500.0000 mL | INTRAVENOUS | Status: DC | PRN

## 2013-02-23 MED ORDER — LIDOCAINE HCL (PF) 1 % IJ SOLN
30.0000 mL | INTRAMUSCULAR | Status: DC | PRN
  Filled 2013-02-23: qty 30

## 2013-02-23 MED ORDER — LACTATED RINGERS IV SOLN
500.0000 mL | Freq: Once | INTRAVENOUS | Status: AC
  Administered 2013-02-23: 10:00:00 via INTRAVENOUS

## 2013-02-23 MED ORDER — OXYTOCIN BOLUS FROM INFUSION
500.0000 mL | INTRAVENOUS | Status: DC
  Administered 2013-02-23: 500 mL via INTRAVENOUS

## 2013-02-23 MED ORDER — EPHEDRINE 5 MG/ML INJ
10.0000 mg | INTRAVENOUS | Status: DC | PRN
  Filled 2013-02-23: qty 2

## 2013-02-23 MED ORDER — ONDANSETRON HCL 4 MG/2ML IJ SOLN: 4.0000 mg | Freq: Four times a day (QID) | INTRAMUSCULAR | Status: DC | PRN

## 2013-02-23 MED ORDER — IBUPROFEN 600 MG PO TABS
600.0000 mg | ORAL_TABLET | Freq: Four times a day (QID) | ORAL | Status: DC
  Administered 2013-02-23 – 2013-02-25 (×6): 600 mg via ORAL
  Filled 2013-02-23 (×7): qty 1

## 2013-02-23 MED ORDER — BUPIVACAINE HCL (PF) 0.25 % IJ SOLN
INTRAMUSCULAR | Status: DC | PRN
  Administered 2013-02-23 (×4): 5 mL

## 2013-02-23 MED ORDER — PHENYLEPHRINE 40 MCG/ML (10ML) SYRINGE FOR IV PUSH (FOR BLOOD PRESSURE SUPPORT)
80.0000 ug | PREFILLED_SYRINGE | INTRAVENOUS | Status: DC | PRN
  Filled 2013-02-23: qty 5

## 2013-02-23 MED ORDER — OXYTOCIN 40 UNITS IN LACTATED RINGERS INFUSION - SIMPLE MED
1.0000 m[IU]/min | INTRAVENOUS | Status: DC
  Administered 2013-02-23: 2 m[IU]/min via INTRAVENOUS

## 2013-02-23 MED ORDER — TETANUS-DIPHTH-ACELL PERTUSSIS 5-2.5-18.5 LF-MCG/0.5 IM SUSP
0.5000 mL | Freq: Once | INTRAMUSCULAR | Status: AC
  Administered 2013-02-24: 0.5 mL via INTRAMUSCULAR
  Filled 2013-02-23: qty 0.5

## 2013-02-23 MED ORDER — ONDANSETRON HCL 4 MG PO TABS: 4.0000 mg | ORAL_TABLET | ORAL | Status: DC | PRN

## 2013-02-23 MED ORDER — BUTORPHANOL TARTRATE 1 MG/ML IJ SOLN
2.0000 mg | INTRAMUSCULAR | Status: DC | PRN
  Administered 2013-02-23: 2 mg via INTRAVENOUS
  Filled 2013-02-23: qty 2

## 2013-02-23 MED ORDER — IBUPROFEN 600 MG PO TABS: 600.0000 mg | ORAL_TABLET | Freq: Four times a day (QID) | ORAL | Status: DC | PRN

## 2013-02-23 MED ORDER — TERBUTALINE SULFATE 1 MG/ML IJ SOLN
0.2500 mg | Freq: Once | INTRAMUSCULAR | Status: DC | PRN
Start: 2013-02-23 — End: 2013-02-23

## 2013-02-23 MED ORDER — SENNOSIDES-DOCUSATE SODIUM 8.6-50 MG PO TABS
2.0000 | ORAL_TABLET | Freq: Every day | ORAL | Status: DC
  Administered 2013-02-23 – 2013-02-24 (×2): 2 via ORAL

## 2013-02-23 MED ORDER — LANOLIN HYDROUS EX OINT: TOPICAL_OINTMENT | CUTANEOUS | Status: DC | PRN

## 2013-02-23 MED ORDER — ONDANSETRON HCL 4 MG/2ML IJ SOLN: 4.0000 mg | INTRAMUSCULAR | Status: DC | PRN

## 2013-02-23 MED ORDER — ZOLPIDEM TARTRATE 5 MG PO TABS: 5.0000 mg | ORAL_TABLET | Freq: Every evening | ORAL | Status: DC | PRN

## 2013-02-23 MED ORDER — EPHEDRINE 5 MG/ML INJ
10.0000 mg | INTRAVENOUS | Status: DC | PRN
  Filled 2013-02-23: qty 4
  Filled 2013-02-23: qty 2

## 2013-02-23 MED ORDER — LACTATED RINGERS IV SOLN
INTRAVENOUS | Status: DC
  Administered 2013-02-23 (×2): via INTRAVENOUS

## 2013-02-23 MED ORDER — DIPHENHYDRAMINE HCL 25 MG PO CAPS: 25.0000 mg | ORAL_CAPSULE | Freq: Four times a day (QID) | ORAL | Status: DC | PRN

## 2013-02-23 MED ORDER — OXYCODONE-ACETAMINOPHEN 5-325 MG PO TABS
1.0000 | ORAL_TABLET | ORAL | Status: DC | PRN
  Administered 2013-02-23: 1 via ORAL
  Administered 2013-02-24: 2 via ORAL
  Administered 2013-02-24: 1 via ORAL
  Administered 2013-02-24 – 2013-02-25 (×4): 2 via ORAL
  Filled 2013-02-23 (×2): qty 2
  Filled 2013-02-23: qty 1
  Filled 2013-02-23 (×3): qty 2
  Filled 2013-02-23: qty 1

## 2013-02-23 MED ORDER — SIMETHICONE 80 MG PO CHEW: 80.0000 mg | CHEWABLE_TABLET | ORAL | Status: DC | PRN

## 2013-02-23 MED ORDER — OXYCODONE-ACETAMINOPHEN 5-325 MG PO TABS: 1.0000 | ORAL_TABLET | ORAL | Status: DC | PRN

## 2013-02-23 MED ORDER — BENZOCAINE-MENTHOL 20-0.5 % EX AERO
1.0000 "application " | INHALATION_SPRAY | CUTANEOUS | Status: DC | PRN
  Administered 2013-02-23: 1 via TOPICAL
  Filled 2013-02-23: qty 56

## 2013-02-23 MED ORDER — FENTANYL CITRATE 0.05 MG/ML IJ SOLN
100.0000 ug | INTRAMUSCULAR | Status: DC | PRN
  Administered 2013-02-23 (×2): 100 ug via INTRAVENOUS
  Filled 2013-02-23 (×2): qty 2

## 2013-02-23 NOTE — Progress Notes (Signed)
Marie Murphy is a 27 y.o. G3P0020 at [redacted]w[redacted]d by ultrasound admitted for active labor  Subjective:   Objective: BP 126/79  Pulse 84  Temp(Src) 97.9 F (36.6 C) (Oral)  Resp 20  Ht 5\' 9"  (1.753 m)  Wt 280 lb (127.007 kg)  BMI 41.33 kg/m2  SpO2 97%  LMP 05/14/2012      FHT:  FHR: 130's bpm, variability: moderate,  accelerations:  Present,  decelerations:  Absent UC:   regular, every 5 minutes SVE:   Dilation: 5 Effacement (%): 100 Station: -1 Exam by:: DLawson,cnm  Labs: Lab Results  Component Value Date   WBC 11.3* 02/23/2013   HGB 13.6 02/23/2013   HCT 38.6 02/23/2013   MCV 89.1 02/23/2013   PLT 185 02/23/2013    Assessment / Plan: Spontaneous labor, progressing normally  Labor: Progressing normally Preeclampsia:  no signs or symptoms of toxicity, intake and ouput balanced and labs stable Fetal Wellbeing:  Category I Pain Control:  Epidural I/D:  n/a Anticipated MOD:  NSVD  LAWSON, MARIE DARLENE 02/23/2013, 10:17 AM

## 2013-02-23 NOTE — Anesthesia Preprocedure Evaluation (Addendum)
Anesthesia Evaluation  Patient identified by MRN, date of birth, ID band Patient awake    Reviewed: Allergy & Precautions, H&P , Patient's Chart, lab work & pertinent test results  Airway Mallampati: III TM Distance: >3 FB Neck ROM: Full    Dental no notable dental hx. (+) Teeth Intact   Pulmonary former smoker,  breath sounds clear to auscultation  Pulmonary exam normal       Cardiovascular + dysrhythmias Rhythm:Regular Rate:Normal     Neuro/Psych  Headaches, negative psych ROS   GI/Hepatic negative GI ROS, (+)     substance abuse  marijuana use,   Endo/Other  Hypothyroidism Morbid obesity  Renal/GU   negative genitourinary   Musculoskeletal negative musculoskeletal ROS (+)   Abdominal (+) + obese,   Peds  Hematology negative hematology ROS (+)   Anesthesia Other Findings Pierced tongue  Reproductive/Obstetrics (+) Pregnancy                          Anesthesia Physical Anesthesia Plan  ASA: III  Anesthesia Plan: Epidural   Post-op Pain Management:    Induction:   Airway Management Planned: Natural Airway  Additional Equipment:   Intra-op Plan:   Post-operative Plan:   Informed Consent: I have reviewed the patients History and Physical, chart, labs and discussed the procedure including the risks, benefits and alternatives for the proposed anesthesia with the patient or authorized representative who has indicated his/her understanding and acceptance.   Dental advisory given  Plan Discussed with: Anesthesiologist  Anesthesia Plan Comments:         Anesthesia Quick Evaluation

## 2013-02-23 NOTE — Progress Notes (Signed)
Marie Murphy is a 27 y.o. G3P0020 at [redacted]w[redacted]d by ultrasound admitted for early labor.  Subjective:   Objective: BP 122/87  Pulse 90  Temp(Src) 98 F (36.7 C) (Oral)  Resp 18  Ht 5\' 9"  (1.753 m)  Wt 280 lb (127.007 kg)  BMI 41.33 kg/m2  SpO2 96%  LMP 05/14/2012   Total I/O In: -  Out: 250 [Urine:250]  FHT:  FHR: 130-140 bpm, variability: moderate,  accelerations:  Present,  decelerations:  Absent UC:   regular, every 5 minutes SVE:   Dilation: Lip/rim Effacement (%): 100 Station: 0 Exam by:: DLawson,cnm  Labs: Lab Results  Component Value Date   WBC 11.3* 02/23/2013   HGB 13.6 02/23/2013   HCT 38.6 02/23/2013   MCV 89.1 02/23/2013   PLT 185 02/23/2013    Assessment / Plan: Spontaneous labor, progressing normally  Labor: Progressing normally Preeclampsia:  no signs or symptoms of toxicity and intake and ouput balanced Fetal Wellbeing:  Category I Pain Control:  Epidural I/D:  n/a Anticipated MOD:  NSVD  Kerington Hildebrant DARLENE 02/23/2013, 3:08 PM

## 2013-02-23 NOTE — H&P (Signed)
Marie Murphy is a 27 y.o. G5P0020 female at [redacted]w[redacted]d by LMP which correlates well w/ 6wk u/s, presenting for early active labor. SVE in office on 7/14 cl/th/-3, initial exam MAU 1.5/80/-1 posterior, 1hr later 3/90/0 midline, vtx. States uc's all day yesterday, worsening last night. Reports good fm. Denies lof, vb, Denies ha, scotomata, ruq/epigastric pain, n/v.  Had neg pre-e labs on 7/14.  Initiated pnc at [employer] @ 6wks. Integrated screening neg, RPR+ w/ neg tpall on initial labs and 3rd trimester labs. +cold antibody screen. +THC w/ repeat neg at 23wks. Anatomy scan normal female, 2hr gtt normal: 90/123/121, gbs neg. Had been wanting to do waterbirth, but has changed mind d/t pain. Plans to breastfeed, POPs for contraception.  Maternal Medical History:  Reason for admission: Contractions.  Nausea.  Contractions: Onset was 6-12 hours ago.   Frequency: regular.   Perceived severity is strong.    Fetal activity: Perceived fetal activity is normal.   Last perceived fetal movement was within the past hour.    Prenatal Complications - Diabetes: none.    OB History   Grav Para Term Preterm Abortions TAB SAB Ect Mult Living   3    2  2         Past Medical History  Diagnosis Date  . Headache(784.0)   . Arrhythmia   . Ovarian cyst   . Hypothyroidism     no meds since 2012   Past Surgical History  Procedure Laterality Date  . Tonsillectomy    . Dilation and curettage of uterus     Family History: family history includes Bipolar disorder in her mother; Cancer in her maternal aunt and maternal grandmother; Coronary artery disease in her maternal uncle; Diabetes in her maternal aunt, maternal grandmother, maternal uncle, and mother; Hypertension in her maternal grandmother and mother; Stroke in her maternal grandmother and maternal uncle; and Thyroid disease in her maternal grandmother, maternal uncle, mother, and paternal aunt. Social History:  reports that she quit smoking about 9 months ago.  Her smoking use included Cigarettes. She smoked 1.00 pack per day. She has never used smokeless tobacco. She reports that  drinks alcohol. She reports that she uses illicit drugs (Marijuana).   Review of Systems  Constitutional: Negative.   HENT: Negative.   Eyes: Negative.  Negative for blurred vision and double vision.  Respiratory: Negative.   Cardiovascular: Negative.   Gastrointestinal: Positive for abdominal pain (uc's). Negative for nausea and vomiting.  Genitourinary: Negative.   Musculoskeletal: Negative.   Skin: Negative.   Neurological: Negative.  Negative for headaches.  Endo/Heme/Allergies: Negative.   Psychiatric/Behavioral: Negative.     Dilation: 3 Effacement (%): 90 Station: 0 Exam by:: B Mosca Blood pressure 134/89, pulse 75, temperature 97.7 F (36.5 C), temperature source Oral, resp. rate 20, height 5\' 9"  (1.753 m), weight 127.007 kg (280 lb), last menstrual period 05/14/2012. Maternal Exam:  Uterine Assessment: Contraction strength is moderate.  Contraction frequency is regular.   Abdomen: Patient reports no abdominal tenderness.   Fetal Exam Fetal Monitor Review: Mode: ultrasound.   Baseline rate: 125.  Variability: moderate (6-25 bpm).   Pattern: accelerations present and no decelerations.    Fetal State Assessment: Category I - tracings are normal.     Physical Exam  Constitutional: She is oriented to person, place, and time. She appears well-developed and well-nourished.  HENT:  Head: Normocephalic.  Neck: Normal range of motion.  Cardiovascular: Normal rate and regular rhythm.   Respiratory: Effort normal and breath  sounds normal.  GI: Soft.  gravid  Genitourinary:  SVE by MAU RN: initial 1.5/80/-1 posterior, 1hr later 3/90/0 midline, vtx  Musculoskeletal: Normal range of motion. Edema: trace BLE.  Neurological: She is alert and oriented to person, place, and time. She has normal reflexes.  No clonus   Skin: Skin is warm and dry.   Psychiatric: She has a normal mood and affect. Her behavior is normal. Judgment and thought content normal.    Prenatal labs: ABO, Rh: O/Positive/-- (11/18 0000) Antibody: Positive (11/18 0000) Rubella: Immune (11/18 0000) RPR: REACTIVE (04/17 0940)  HBsAg: Negative (11/18 0000)  HIV: NON REACTIVE (04/17 0940)  GBS: NEGATIVE (06/23 1626)   Assessment/Plan: A:  [redacted]w[redacted]d SIUP  G3P0020   Early active labor w/ increased bp's 140s/90s  Cat I FHR  GBS neg  +RPR w/ -Tpall x 2  +cold antibody screen  H/O hypothyroidism, no meds  +THC early preg, repeat neg  P:  Admit to BS  IV pain meds/epidural prn  CBC, CMP, P:C ratio pending  Expectant management  Anticipate NSVD  Monitor BPs    [name] 02/23/2013, 4:55 AM

## 2013-02-23 NOTE — Progress Notes (Signed)
Marie Murphy is a 27 y.o. G3P0020 at [redacted]w[redacted]d admitted for early labor w/ initially elevated bp's  Subjective: Uncomfortable w/ uc's, has received few doses of fentanyl w/o much relief, requests another iv med before wanting epidural  Objective: BP 132/88  Pulse 73  Temp(Src) 97.7 F (36.5 C) (Oral)  Resp 18  Ht 5\' 9"  (1.753 m)  Wt 127.007 kg (280 lb)  BMI 41.33 kg/m2  LMP 05/14/2012     BPs 120-130s/80s-90s  FHT:  FHR: 120 bpm, variability: moderate,  accelerations:  Present,  decelerations:  Absent UC:  q 3-6 SVE:   Dilation: 3.5 Effacement (%): 90 Station: 0 Exam by:: L Lamon RN  Labs: Lab Results  Component Value Date   WBC 11.3* 02/23/2013   HGB 13.6 02/23/2013   HCT 38.6 02/23/2013   MCV 89.1 02/23/2013   PLT 185 02/23/2013    Assessment / Plan: early labor  Labor: early Preeclampsia:  labs stable, P:C ratio still pending Fetal Wellbeing:  Category I Pain Control:  Fentanyl and will switch to stadol I/D:  n/a Anticipated MOD:  NSVD  [name] 02/23/2013, 8:20 AM

## 2013-02-23 NOTE — MAU Note (Signed)
Pt presents to MAU w/ painful uc's

## 2013-02-23 NOTE — H&P (Signed)
Attestation of Attending Supervision of Advanced Practitioner (CNM/NP): Evaluation and management procedures were performed by the Advanced Practitioner under my supervision and collaboration.  I have reviewed the Advanced Practitioner's note and chart, and I agree with the management and plan.  Nasha Diss 02/23/2013 7:54 AM

## 2013-02-23 NOTE — Progress Notes (Signed)
Admission nutrition screen triggered. Patients chart reviewed and assessed  for nutritional risk. Patient is determined to be at low nutrition  risk.   Joye Wesenberg M.Ed. R.D. LDN Neonatal Nutrition Support Specialist Pager [phone]  

## 2013-02-23 NOTE — Anesthesia Procedure Notes (Signed)
Epidural Patient location during procedure: OB Start time: 02/23/2013 9:58 AM  Staffing Anesthesiologist: Graceanne Guin A. Performed by: anesthesiologist   Preanesthetic Checklist Completed: patient identified, site marked, surgical consent, pre-op evaluation, timeout performed, IV checked, risks and benefits discussed and monitors and equipment checked  Epidural Patient position: sitting Prep: site prepped and draped and DuraPrep Patient monitoring: continuous pulse ox and blood pressure Approach: midline Injection technique: LOR air  Needle:  Needle type: Tuohy  Needle gauge: 17 G Needle length: 9 cm and 9 Needle insertion depth: 5 cm cm Catheter type: closed end flexible Catheter size: 19 Gauge Catheter at skin depth: 10 cm Test dose: negative and Other  Assessment Events: blood not aspirated, injection not painful, no injection resistance, negative IV test and no paresthesia  Additional Notes Patient identified. Risks and benefits discussed including failed block, incomplete  Pain control, post dural puncture headache, nerve damage, paralysis, blood pressure Changes, nausea, vomiting, reactions to medications-both toxic and allergic and post Partum back pain. All questions were answered. Patient expressed understanding and wished to proceed. Sterile technique was used throughout procedure. Epidural site was Dressed with sterile barrier dressing. No paresthesias, signs of intravascular injection Or signs of intrathecal spread were encountered.  Patient was more comfortable after the epidural was dosed. Please see RN's note for documentation of vital signs and FHR which are stable.

## 2013-02-24 LAB — CBC
Hemoglobin: 11.3 g/dL — ABNORMAL LOW (ref 12.0–15.0)
MCHC: 33.8 g/dL (ref 30.0–36.0)
Platelets: 158 10*3/uL (ref 150–400)
RDW: 14 % (ref 11.5–15.5)

## 2013-02-24 LAB — T.PALLIDUM AB, IGG: T pallidum Antibodies (TP-PA): 0.13 S/CO (ref <0.90)

## 2013-02-24 NOTE — Progress Notes (Signed)
Post Partum Day 1 Subjective: no complaints, up ad lib, voiding, tolerating PO and + flatus  Objective: Blood pressure 135/78, pulse 85, temperature 97.9 F (36.6 C), temperature source Oral, resp. rate 18, height 5\' 9"  (1.753 m), weight 280 lb (127.007 kg), last menstrual period 05/14/2012, SpO2 94.00%, unknown if currently breastfeeding.  Physical Exam:  General: alert, cooperative, appears stated age and no distress Lochia: appropriate Uterine Fundus: firm Incision: n/a DVT Evaluation: No evidence of DVT seen on physical exam. Negative Homan's sign.   Recent Labs  02/23/13 0530 02/24/13 0610  HGB 13.6 11.3*  HCT 38.6 33.4*    Assessment/Plan: Plan for discharge tomorrow   LOS: 1 day   Marie Murphy DARLENE 02/24/2013, 7:24 AM

## 2013-02-24 NOTE — Lactation Note (Addendum)
This note was copied from the chart of Marie Murphy. Lactation Consultation Note  Patient Name: Marie Murphy [id number] Date: 02/24/2013 Reason for consult: Initial assessment This mom stated on admission - feeding choice is to breast feed  02/23/2013-0307  Per mom baby has been feeding on the left breast well, and I have nom problem latching on that breast. I'm having difficulty on the right breast and he latches and doesn't stay.  Lc changed a large stool diaper and assisted mom with positioning, Lc noted on the right breast the nipple  Is erect , but points downward and this makes it difficult for mom to obtain depth at the breast. After several attempts Latched the baby in cross cradle,per mom prefers to lean into the baby with only one pillow support under baby. Baby latched with assist with depth and fed for 15 mins with multiply swallows and gulps. Per Mom,  comfortable. Denies discomfort. Reviewed basics and the importance of depth at the breast, also the importance of aiming the nipple towards  The baby upper lip , wait for the mouth to open wide and latch compressing the tissue until he is in a consistent pattern.    Maternal Data Formula Feeding for Exclusion: No Has patient been taught Hand Expression?: Yes  Feeding Feeding Type: Breast Milk Length of feed: 15 min (right breast )  LATCH Score/Interventions Latch: Repeated attempts needed to sustain latch, nipple held in mouth throughout feeding, stimulation needed to elicit sucking reflex. Intervention(s): Adjust position;Assist with latch;Breast massage;Breast compression  Audible Swallowing: Spontaneous and intermittent  Type of Nipple: Everted at rest and after stimulation  Comfort (Breast/Nipple): Soft / non-tender     Hold (Positioning): Assistance needed to correctly position infant at breast and maintain latch. (worked on depth and positioning ) Intervention(s): Breastfeeding basics reviewed;Support  Pillows;Position options;Skin to skin  LATCH Score: 8  Lactation Tools Discussed/Used WIC Program: Yes (per mom [employer] )   Consult Status Consult Status: Follow-up Date: 02/25/13 Follow-up type: In-patient    Kathrin Greathouse 02/24/2013, 3:21 PM

## 2013-02-24 NOTE — Progress Notes (Signed)
UR chart review completed.  

## 2013-02-24 NOTE — Anesthesia Postprocedure Evaluation (Signed)
Anesthesia Post Note  Patient: Marie Murphy  Procedure(s) Performed: * No procedures listed *  Anesthesia type: Epidural  Patient location: Mother/Baby  Post pain: Pain level controlled  Post assessment: Post-op Vital signs reviewed  Last Vitals: BP 135/78  Pulse 85  Temp(Src) 36.6 C (Oral)  Resp 18  Ht 5\' 9"  (1.753 m)  Wt 280 lb (127.007 kg)  BMI 41.33 kg/m2  SpO2 94%  LMP 05/14/2012  Post vital signs: Reviewed  Level of consciousness: awake  Complications: No apparent anesthesia complications

## 2013-02-24 NOTE — Progress Notes (Signed)
Clinical Social Work Department BRIEF PSYCHOSOCIAL ASSESSMENT 02/24/2013  Patient:  DULCEY, RIEDERER     Account Number:  [id number]     Admit date:  02/23/2013  Clinical Social Worker:  Almeta Monas  Date/Time:  02/24/2013 01:00 PM  Referred by:  RN  Date Referred:  02/24/2013 Referred for  Substance Abuse   Other Referral:   Interview type:  Family Other interview type:   chart review    PSYCHOSOCIAL DATA Living Status:  FAMILY Admitted from facility:   Level of care:   Primary support name:  Benny Jelinek Primary support relationship to patient:  SPOUSE Degree of support available:   Good    CURRENT CONCERNS Current Concerns  None Noted   Other Concerns:    SOCIAL WORK ASSESSMENT / PLAN CSW met with parents in MOB's first floor room/146 to complete assessment for hx of substance use.  CSW notes that MOB's PNR has marijuana documented as the substance used.  CSW informed parents of hospital drug screen policy. CSW discussed signs and symptoms of PPD and ensured that MOB feels comfortable calling her MD if symptoms/concerns arise.   Assessment/plan status:  No Further Intervention Required Other assessment/ plan:   Baby's UDS negative.  CSW will monitor meconium drug screen.   Information/referral to community resources:   No referral needs identified at this time.    PATIENT'S/FAMILY'S RESPONSE TO PLAN OF CARE: Parents seemed uninterested in speaking with CSW and FOB questioned CSW as to who CSW was "with."  CSW explained being employed by the hospital and CSW's role.  Parents state no questions, concerns or needs and were understanding of drug screen policy.  MOB denies all use during pregnancy.

## 2013-02-25 ENCOUNTER — Inpatient Hospital Stay (HOSPITAL_COMMUNITY): Admission: RE | Admit: 2013-02-25 | Payer: Medicaid Other | Source: Ambulatory Visit

## 2013-02-25 MED ORDER — SENNOSIDES-DOCUSATE SODIUM 8.6-50 MG PO TABS: 2.0000 | ORAL_TABLET | Freq: Every day | ORAL | Status: DC

## 2013-02-25 MED ORDER — IBUPROFEN 600 MG PO TABS: 600.0000 mg | ORAL_TABLET | Freq: Four times a day (QID) | ORAL | Status: DC

## 2013-02-25 NOTE — Discharge Summary (Signed)
Obstetric Discharge Summary Reason for Admission: onset of labor Prenatal Procedures: none Intrapartum Procedures: spontaneous vaginal delivery Postpartum Procedures: none Complications-Operative and Postpartum: 1st degree perineal laceration  Patient is breastfeeding and undecided regarding contraception.  Hemoglobin  Date Value Range Status  02/24/2013 11.3* 12.0 - 15.0 g/dL Final     DELTA CHECK NOTED     REPEATED TO VERIFY     HCT  Date Value Range Status  02/24/2013 33.4* 36.0 - 46.0 % Final    Physical Exam:  General: alert, cooperative and no distress Lochia: appropriate Uterine Fundus: firm Incision: n/a DVT Evaluation: No evidence of DVT seen on physical exam. Negative Homan's sign. No cords or calf tenderness. No significant calf/ankle edema.  Discharge Diagnoses: Term Pregnancy-delivered  Discharge Information: Date: 02/25/2013 Activity: pelvic rest Diet: routine Medications: PNV, Ibuprofen and Colace Condition: stable Instructions: refer to practice specific booklet Discharge to: home Follow-up Information   Follow up with [facility]. Schedule an appointment as soon as possible for a visit in 6 weeks. (For postpartum visit)    Contact information:   [address] [place] [address] [phone]      Newborn Data: Live born female  Birth Weight: 8 lb (3629 g) APGAR: 9, 9  Home with mother.  Napoleon Form 02/25/2013, 7:51 AM

## 2013-02-27 LAB — TYPE AND SCREEN
ABO/RH(D): O POS
Antibody Screen: POSITIVE
DAT, IgG: POSITIVE
Unit division: 0
Unit division: 0

## 2013-03-02 ENCOUNTER — Inpatient Hospital Stay (HOSPITAL_COMMUNITY)
Admission: AD | Admit: 2013-03-02 | Discharge: 2013-03-02 | Disposition: A | Discharge status: Home / Self Care | Payer: Medicaid Other | Source: Ambulatory Visit | Attending: Obstetrics and Gynecology | Admitting: Obstetrics and Gynecology

## 2013-03-02 DIAGNOSIS — IMO0002 Reserved for concepts with insufficient information to code with codable children: Secondary | ICD-10-CM

## 2013-03-02 DIAGNOSIS — O135 Gestational [pregnancy-induced] hypertension without significant proteinuria, complicating the puerperium: Secondary | ICD-10-CM | POA: Insufficient documentation

## 2013-03-02 LAB — PROTEIN / CREATININE RATIO, URINE: Creatinine, Urine: 69.49 mg/dL

## 2013-03-02 MED ORDER — ACETAMINOPHEN 500 MG PO TABS
1000.0000 mg | ORAL_TABLET | Freq: Once | ORAL | Status: AC
  Administered 2013-03-02: 1000 mg via ORAL
  Filled 2013-03-02: qty 2

## 2013-03-02 MED ORDER — AMLODIPINE BESYLATE 5 MG PO TABS: 5.0000 mg | ORAL_TABLET | Freq: Every day | ORAL | Status: DC

## 2013-03-02 MED ORDER — HYDROCHLOROTHIAZIDE 25 MG PO TABS: 25.0000 mg | ORAL_TABLET | Freq: Every day | ORAL | Status: DC

## 2013-03-02 NOTE — MAU Provider Note (Signed)
History     CSN: [id number]  Arrival date and time: 03/02/13 0730   First Provider Initiated Contact with Patient 03/02/13 [phone]      Chief Complaint  Patient presents with  . Blood Pressure Check   HPI 27 y.o. [id number] at 1 week postpartum sent from [facility] ED for further eval of elevated BP. BPs 140s-160s/90s-110 there. CBC and CMP done and normal. Pt presented to them overnight d/t increased swelling in ankles. Denies new onset headaches, no vision change, no abdominal pain or n/v.   Past Medical History  Diagnosis Date  . Headache(784.0)   . Arrhythmia   . Ovarian cyst   . Hypothyroidism     no meds since 2012    Past Surgical History  Procedure Laterality Date  . Tonsillectomy    . Dilation and curettage of uterus      Family History  Problem Relation Age of Onset  . Diabetes Mother   . Hypertension Mother   . Bipolar disorder Mother   . Thyroid disease Mother   . Cancer Maternal Aunt     ovarian  . Diabetes Maternal Aunt   . Diabetes Maternal Uncle   . Stroke Maternal Uncle   . Coronary artery disease Maternal Uncle   . Thyroid disease Maternal Uncle   . Thyroid disease Paternal Aunt   . Cancer Maternal Grandmother   . Diabetes Maternal Grandmother   . Hypertension Maternal Grandmother   . Stroke Maternal Grandmother   . Thyroid disease Maternal Grandmother     History  Substance Use Topics  . Smoking status: Former Smoker -- 1.00 packs/day    Types: Cigarettes    Quit date: 05/24/2012  . Smokeless tobacco: Never Used     Comment: down to 1 cigarette a day  . Alcohol Use: Yes     Comment: None since + preg Test    Allergies: No Known Allergies  Prescriptions prior to admission  Medication Sig Dispense Refill  . Prenatal Vit-Fe Fumarate-FA (PRENATAL MULTIVITAMIN) TABS Take 1 tablet by mouth daily at 12 noon.        Review of Systems  Constitutional: Negative.   Respiratory: Negative.   Cardiovascular: Negative.   Gastrointestinal:  Negative for nausea, vomiting, abdominal pain, diarrhea and constipation.  Genitourinary: Negative for dysuria, urgency, frequency, hematuria and flank pain.       + lochia   Musculoskeletal: Negative.   Neurological: Negative.   Psychiatric/Behavioral: Negative.    Physical Exam   Blood pressure 145/89, pulse 74, temperature 98.1 F (36.7 C), temperature source Oral, resp. rate 18, last menstrual period 05/14/2012, currently breastfeeding.  Physical Exam  Nursing note and vitals reviewed. Constitutional: She is oriented to person, place, and time. She appears well-developed and well-nourished. No distress.  Cardiovascular: Normal rate.   Respiratory: Effort normal.  GI: Soft. There is no tenderness.  Musculoskeletal: Normal range of motion.  Neurological: She is alert and oriented to person, place, and time.  Skin: Skin is dry.  Psychiatric: She has a normal mood and affect.    MAU Course  Procedures  Results for orders placed during the hospital encounter of 03/02/13 (from the past 24 hour(s))  PROTEIN / CREATININE RATIO, URINE     Status: None   Collection Time    03/02/13  8:30 AM      Result Value Range   Creatinine, Urine 69.49     Total Protein, Urine 4.5     PROTEIN CREATININE RATIO 0.06  0.00 -  0.15   Patient Vitals for the past 24 hrs:  BP Temp Temp src Pulse Resp  03/02/13 1118 145/89 mmHg - - 74 18  03/02/13 1104 137/96 mmHg - - - -  03/02/13 1053 139/89 mmHg - - 74 16  03/02/13 1025 116/65 mmHg - - 80 16  03/02/13 0830 120/73 mmHg - - 79 16  03/02/13 0815 125/73 mmHg - - 87 18  03/02/13 0800 146/93 mmHg - - 80 -  03/02/13 0749 151/101 mmHg 98.1 F (36.7 C) Oral 81 18     Assessment and Plan   1. Hypertension, postpartum condition or complication, unspecified trimester   Rev'd precautions, f/u at scheduled visit at [facility]. Start HCTZ and Norvasc now.     Medication List         amLODipine 5 MG tablet  Commonly known as:  NORVASC  Take 1  tablet (5 mg total) by mouth daily.     hydrochlorothiazide 25 MG tablet  Commonly known as:  HYDRODIURIL  Take 1 tablet (25 mg total) by mouth daily.     prenatal multivitamin Tabs  Take 1 tablet by mouth daily at 12 noon.            Follow-up Information   Follow up with [facility] In 1 week.   Contact information:   [address] [place] [address] [phone]       Allaina Brotzman 03/02/2013, 11:54 AM

## 2013-03-02 NOTE — MAU Note (Signed)
Ms. Marie Murphy is here for a BP check. She went to [facility] hospital last night for ankle swelling. When she got there her BP was elevated. She is status post vaginal delivery 1 week ago. She was sent over here for further evaluation.

## 2013-03-02 NOTE — MAU Provider Note (Signed)
Attestation of Attending Supervision of Advanced Practitioner: Evaluation and management procedures were performed by the PA/NP/CNM/OB Fellow under my supervision/collaboration. Chart reviewed and agree with management and plan.  Shaday Rayborn V 03/02/2013 8:58 PM

## 2013-03-03 ENCOUNTER — Ambulatory Visit: Payer: Medicaid Other | Admitting: Adult Health

## 2013-03-03 ENCOUNTER — Telehealth: Payer: Self-pay | Admitting: Obstetrics and Gynecology

## 2013-03-03 NOTE — Telephone Encounter (Signed)
Pt states seen at [facility] yesterday given 2 medications for hypertension and swelling started meds last night, does she need to keep her appt for today. Per Victorino Dike pt to keep appt today to see if blood pressure and swelling improving. Pt states could not come today or tomorrow due to new infant. Appt made for Wednesday of this week to see provider. Pt also stated was told at [facility] would take a few days before could see results of meds.

## 2013-03-04 ENCOUNTER — Inpatient Hospital Stay (HOSPITAL_COMMUNITY): Admission: RE | Admit: 2013-03-04 | Payer: Self-pay | Source: Ambulatory Visit

## 2013-03-05 ENCOUNTER — Ambulatory Visit (INDEPENDENT_AMBULATORY_CARE_PROVIDER_SITE_OTHER): Payer: Medicaid Other | Admitting: Advanced Practice Midwife

## 2013-03-05 ENCOUNTER — Encounter: Payer: Self-pay | Admitting: Advanced Practice Midwife

## 2013-03-05 VITALS — BP 110/80 | Wt 254.0 lb

## 2013-03-05 DIAGNOSIS — E079 Disorder of thyroid, unspecified: Secondary | ICD-10-CM

## 2013-03-05 DIAGNOSIS — E669 Obesity, unspecified: Secondary | ICD-10-CM

## 2013-03-05 DIAGNOSIS — O139 Gestational [pregnancy-induced] hypertension without significant proteinuria, unspecified trimester: Secondary | ICD-10-CM

## 2013-03-05 DIAGNOSIS — O09299 Supervision of pregnancy with other poor reproductive or obstetric history, unspecified trimester: Secondary | ICD-10-CM

## 2013-03-05 DIAGNOSIS — O99019 Anemia complicating pregnancy, unspecified trimester: Secondary | ICD-10-CM

## 2013-03-05 NOTE — Patient Instructions (Signed)
Stop all b/p medicines 2 days before your post partum exam.

## 2013-03-05 NOTE — Progress Notes (Signed)
Marie Murphy 27 y.o. 10 days pp SVD w/o complications, here for f/u GHTN, diagnosed post partum in the MAU 03/02/13.    Current outpatient prescriptions:amLODipine (NORVASC) 5 MG tablet, Take 1 tablet (5 mg total) by mouth daily., Disp: 30 tablet, Rfl: 0;  hydrochlorothiazide (HYDRODIURIL) 25 MG tablet, Take 1 tablet (25 mg total) by mouth daily., Disp: 30 tablet, Rfl: 0;  Prenatal Vit-Fe Fumarate-FA (PRENATAL MULTIVITAMIN) TABS, Take 1 tablet by mouth daily at 12 noon., Disp: , Rfl:   started 7/27.   Filed Vitals:   03/05/13 1620  BP: 110/80   Review of Systems   Constitutional: Negative for fever and chills Eyes: Negative for visual disturbances Respiratory: Negative for shortness of breath, dyspnea Cardiovascular: Negative for chest pain or palpitations  Gastrointestinal: Negative for vomiting, diarrhea and constipation Genitourinary: Negative for dysuria and urgency Musculoskeletal: Negative for back pain, joint pain, myalgias  Neurological: Negative for dizziness and headaches  Physical Exam  Constitutional: She is oriented to person, place, and time and well-developed, well-nourished, and in no distress.  HENT:  Head: Normocephalic.  Eyes: Pupils are equal, round, and reactive to light.  Cardiovascular: Normal rate and regular rhythm.   Pulmonary/Chest: Breath sounds normal.  Abdominal: Soft. There is no tenderness.  Musculoskeletal: She exhibits no edema.  Neurological: She is alert and oriented to person, place, and time. She has normal reflexes.  Skin: Skin is warm and dry.    A/P:  GHTN, controlled with meds Continue current drug regimen.  D/C meds 2 days before PP exam so we can see what her b/p is off the meds.

## 2013-03-12 ENCOUNTER — Telehealth: Payer: Self-pay | Admitting: *Deleted

## 2013-03-12 NOTE — Telephone Encounter (Signed)
[name] from the [facility] states pt seen in ER for elevated B/P. B/P today 110/80 bilateral arms, trace of edema on ankles. Pt continues to take Norvasc 5 mg and HCTZ 25mg  daily. Pt has no c/o headaches, dizziness, or chest pain. [name] informed for pt to keep scheduled appt and to call office back with any questions or complications. [name], NP informed of above information and agreed with plan.

## 2013-04-02 ENCOUNTER — Ambulatory Visit (INDEPENDENT_AMBULATORY_CARE_PROVIDER_SITE_OTHER): Payer: Medicaid Other | Admitting: Obstetrics & Gynecology

## 2013-04-02 ENCOUNTER — Encounter: Payer: Self-pay | Admitting: Obstetrics & Gynecology

## 2013-04-02 NOTE — Progress Notes (Signed)
Patient ID: SYLVANNA BURGGRAF, female   DOB: 26-Sep-1985, 27 y.o.   MRN: [id number] Author: Ferdie Ping, CNM Service: (none) Author Type: Certified Nurse Midwife   Filed: 02/23/2013 8:00 PM Note Time: 02/23/2013 7:58 PM     Related Notes: Cosigned by Catalina Antigua, MD filed at 02/27/2013 8:09 PM       Delivery Note  At 7:41 PM a viable female was delivered via Vaginal, Spontaneous Delivery (Presentation:oa ; ). APGAR:9 ,9 ; weight .  Placenta status:spont via shultz , . Cord3 vc: with the following complications:none . Cord pH: n/a  Anesthesia: Epidural  Episiotomy: None  Lacerations: 1st degree;Labial  Suture Repair: n/a  Est. Blood Loss (mL): 300  Mom to postpartum. Baby to nursery-stable.  LAWSON, MARIE DARLENE  02/23/2013, 7:58 PM      PP exam today normal Normal involution  BCM:  Condoms and abstinence  Fu 6 months

## 2014-06-08 ENCOUNTER — Encounter: Payer: Self-pay | Admitting: Obstetrics & Gynecology

## 2014-08-07 NOTE — L&D Delivery Note (Signed)
Delivery Note At 3:48 PM a viable female was delivered via  (Presentation:vertes LOA ;  ).  APGAR:9 , 9; weight  .   Placenta status:spont , 3 vc.  Cord:  with the following complications:none .  Cord pH: n/a  Anesthesia: epidural  Episiotomy: none  Lacerations:  none Suture Repair: n/a Est. Blood Loss 150(mL):    Mom to postpartum.  Baby to Couplet care / Skin to Skin.  Marie Murphy, Marie Murphy 02/21/2015, 3:59 PM

## 2014-08-24 ENCOUNTER — Other Ambulatory Visit: Payer: Self-pay | Admitting: Obstetrics & Gynecology

## 2014-08-24 DIAGNOSIS — O3680X0 Pregnancy with inconclusive fetal viability, not applicable or unspecified: Secondary | ICD-10-CM

## 2014-08-27 ENCOUNTER — Ambulatory Visit: Payer: Medicaid Other

## 2014-09-01 ENCOUNTER — Other Ambulatory Visit: Payer: Medicaid Other

## 2014-09-04 ENCOUNTER — Encounter: Payer: Self-pay | Admitting: Obstetrics & Gynecology

## 2014-09-04 ENCOUNTER — Other Ambulatory Visit: Payer: Self-pay | Admitting: Obstetrics & Gynecology

## 2014-09-04 ENCOUNTER — Ambulatory Visit (INDEPENDENT_AMBULATORY_CARE_PROVIDER_SITE_OTHER): Payer: Medicaid Other

## 2014-09-04 DIAGNOSIS — O09292 Supervision of pregnancy with other poor reproductive or obstetric history, second trimester: Secondary | ICD-10-CM

## 2014-09-04 DIAGNOSIS — O3680X0 Pregnancy with inconclusive fetal viability, not applicable or unspecified: Secondary | ICD-10-CM

## 2014-09-04 DIAGNOSIS — O0932 Supervision of pregnancy with insufficient antenatal care, second trimester: Secondary | ICD-10-CM

## 2014-09-04 NOTE — Progress Notes (Signed)
U/S-single active fetus, meas c/w 16+4 wks EDD 02/15/2015, cx appears closed (3.2cm), bilateral adnexa appears WNL, FHR_ 145 bpm, posterior Gr 0 placenta, will return for full anatomy screen, female fetus

## 2014-09-10 ENCOUNTER — Ambulatory Visit (INDEPENDENT_AMBULATORY_CARE_PROVIDER_SITE_OTHER): Payer: Medicaid Other | Admitting: Advanced Practice Midwife

## 2014-09-10 ENCOUNTER — Other Ambulatory Visit (HOSPITAL_COMMUNITY)
Admission: RE | Admit: 2014-09-10 | Discharge: 2014-09-10 | Disposition: A | Discharge status: Home / Self Care | Payer: Medicaid Other | Source: Ambulatory Visit | Attending: Advanced Practice Midwife | Admitting: Advanced Practice Midwife

## 2014-09-10 ENCOUNTER — Encounter: Payer: Self-pay | Admitting: Advanced Practice Midwife

## 2014-09-10 VITALS — BP 110/70 | Wt 225.0 lb

## 2014-09-10 DIAGNOSIS — Z363 Encounter for antenatal screening for malformations: Secondary | ICD-10-CM

## 2014-09-10 DIAGNOSIS — Z114 Encounter for screening for human immunodeficiency virus [HIV]: Secondary | ICD-10-CM

## 2014-09-10 DIAGNOSIS — Z113 Encounter for screening for infections with a predominantly sexual mode of transmission: Secondary | ICD-10-CM | POA: Insufficient documentation

## 2014-09-10 DIAGNOSIS — O0932 Supervision of pregnancy with insufficient antenatal care, second trimester: Secondary | ICD-10-CM

## 2014-09-10 DIAGNOSIS — Z3492 Encounter for supervision of normal pregnancy, unspecified, second trimester: Secondary | ICD-10-CM

## 2014-09-10 DIAGNOSIS — Z331 Pregnant state, incidental: Secondary | ICD-10-CM

## 2014-09-10 DIAGNOSIS — Z1389 Encounter for screening for other disorder: Secondary | ICD-10-CM

## 2014-09-10 DIAGNOSIS — Z3490 Encounter for supervision of normal pregnancy, unspecified, unspecified trimester: Secondary | ICD-10-CM | POA: Insufficient documentation

## 2014-09-10 DIAGNOSIS — Z01419 Encounter for gynecological examination (general) (routine) without abnormal findings: Secondary | ICD-10-CM | POA: Diagnosis not present

## 2014-09-10 DIAGNOSIS — Z124 Encounter for screening for malignant neoplasm of cervix: Secondary | ICD-10-CM

## 2014-09-10 DIAGNOSIS — Z1151 Encounter for screening for human papillomavirus (HPV): Secondary | ICD-10-CM | POA: Diagnosis present

## 2014-09-10 DIAGNOSIS — Z0283 Encounter for blood-alcohol and blood-drug test: Secondary | ICD-10-CM

## 2014-09-10 DIAGNOSIS — Z3682 Encounter for antenatal screening for nuchal translucency: Secondary | ICD-10-CM

## 2014-09-10 DIAGNOSIS — Z0184 Encounter for antibody response examination: Secondary | ICD-10-CM

## 2014-09-10 DIAGNOSIS — Z3482 Encounter for supervision of other normal pregnancy, second trimester: Secondary | ICD-10-CM

## 2014-09-10 DIAGNOSIS — E039 Hypothyroidism, unspecified: Secondary | ICD-10-CM

## 2014-09-10 LAB — POCT URINALYSIS DIPSTICK
GLUCOSE UA: NEGATIVE
KETONES UA: NEGATIVE
Leukocytes, UA: NEGATIVE
NITRITE UA: NEGATIVE
PROTEIN UA: NEGATIVE

## 2014-09-10 MED ORDER — PRENATAL VITAMINS 0.8 MG PO TABS: 1.0000 | ORAL_TABLET | Freq: Every day | ORAL | Status: DC

## 2014-09-10 NOTE — Progress Notes (Signed)
Subjective:    Tayla Panozzo is a [id number] [redacted]w[redacted]d being seen today for her first obstetrical visit.  She is still nursing her 75 month old.  Plans to try to wean him.  Her obstetrical history is significant for nothing.  Pregnancy history fully reviewed. Had postpartum GHTN.   Patient reports no complaints.  Filed Vitals:   09/10/14 1520  BP: 110/70  Weight: 225 lb (102.059 kg)    HISTORY: OB History  Gravida Para Term Preterm AB SAB TAB Ectopic Multiple Living  # Outcome Date GA Lbr Len/2nd Weight Sex Delivery Anes PTL Lv  4 Current           3 Term 02/23/13 [redacted]w[redacted]d 14:34 / 00:37 8 lb (3.629 kg) M Vag-Spont EPI  Y  2 SAB 2010             Comments: MAB with D&C  1 SAB 2005             Past Medical History  Diagnosis Date  . Headache(784.0)   . Arrhythmia   . Ovarian cyst   . Hypothyroidism     no meds since 2012   Past Surgical History  Procedure Laterality Date  . Tonsillectomy    . Dilation and curettage of uterus     Family History  Problem Relation Age of Onset  . Diabetes Mother   . Hypertension Mother   . Bipolar disorder Mother   . Thyroid disease Mother   . Cancer Maternal Aunt     ovarian  . Diabetes Maternal Aunt   . Diabetes Maternal Uncle   . Stroke Maternal Uncle   . Coronary artery disease Maternal Uncle   . Thyroid disease Maternal Uncle   . Thyroid disease Paternal Aunt   . Cancer Maternal Grandmother   . Diabetes Maternal Grandmother   . Hypertension Maternal Grandmother   . Stroke Maternal Grandmother   . Thyroid disease Maternal Grandmother      Exam       Pelvic Exam:    Perineum: Normal Perineum   Vulva: normal   Vagina:  normal mucosa, normal discharge, no palpable nodules   Uterus Normal, Gravid, FH: u-1     Cervix: normal   Adnexa: Not palpable   Urinary:  urethral meatus normal    System: Breast:  normal appearance, no masses or tenderness   Skin: normal coloration and turgor, no rashes    Neurologic: oriented, normal, normal mood   Extremities: normal strength, tone, and muscle mass   HEENT PERRLA   Mouth/Teeth mucous membranes moist, normal dentition   Neck supple and no masses   Cardiovascular: regular rate and rhythm   Respiratory:  appears well, vitals normal, no respiratory distress, acyanotic   Abdomen: soft, non-tender;  FHR: 147          Assessment:    Pregnancy: [id number] Patient Active Problem List   Diagnosis Date Noted  . Hypothyroidism     Priority: Low  . Supervision of normal pregnancy 09/10/2014  . Late prenatal care in second trimester 09/10/2014  . Migraines 10/09/2012  . Arrhythmia 10/09/2012        Plan:     Initial labs drawn. Continue prenatal vitamins  Problem list reviewed and updated  Reviewed n/v relief measures and warning s/s to report  Reviewed recommended weight gain based on pre-gravid BMI  Encouraged well-balanced diet Genetic Screening discussed Integrated Screen: requested.  Ultrasound discussed; fetal survey: requested.  Follow up in 2 weeks for anatomy scan.  CRESENZO-DISHMAN,Trason Shifflet 09/10/2014

## 2014-09-11 LAB — TSH: TSH: 1.02 u[IU]/mL (ref 0.450–4.500)

## 2014-09-12 LAB — HEPATITIS B SURFACE ANTIGEN: Hepatitis B Surface Ag: NEGATIVE

## 2014-09-12 LAB — ANTIBODY SCREEN

## 2014-09-12 LAB — CBC
HCT: 36.7 % (ref 34.0–46.6)
Hemoglobin: 12.6 g/dL (ref 11.1–15.9)
MCH: 30.7 pg (ref 26.6–33.0)
MCHC: 34.3 g/dL (ref 31.5–35.7)
MCV: 90 fL (ref 79–97)
PLATELETS: 211 10*3/uL (ref 150–379)
RBC: 4.1 x10E6/uL (ref 3.77–5.28)
RDW: 13.3 % (ref 12.3–15.4)
WBC: 6.1 10*3/uL (ref 3.4–10.8)

## 2014-09-12 LAB — AFP, QUAD SCREEN
DIA Mom Value: 1.15
DIA Value (EIA): 165.74 pg/mL
DSR (BY AGE) 1 IN: 656
DSR (SECOND TRIMESTER) 1 IN: 1231
Gestational Age: 17.9 WEEKS
MSAFP MOM: 0.69
MSAFP: 23.3 ng/mL
MSHCG Mom: 1.14
MSHCG: 24713 m[IU]/mL
Maternal Age At EDD: 30.5 YEARS
OSB RISK: 10000
Test Results:: NEGATIVE
WEIGHT: 225 [lb_av]
uE3 Mom: 0.96
uE3 Value: 1.17 ng/mL

## 2014-09-12 LAB — ABO/RH: RH TYPE: POSITIVE

## 2014-09-12 LAB — URINALYSIS, ROUTINE W REFLEX MICROSCOPIC
BILIRUBIN UA: NEGATIVE
Glucose, UA: NEGATIVE
KETONES UA: NEGATIVE
LEUKOCYTES UA: NEGATIVE
NITRITE UA: NEGATIVE
PH UA: 6.5 (ref 5.0–7.5)
RBC, UA: NEGATIVE
Specific Gravity, UA: 1.026 (ref 1.005–1.030)
UUROB: 1 mg/dL (ref 0.2–1.0)

## 2014-09-12 LAB — URINE CULTURE

## 2014-09-12 LAB — HIV ANTIBODY (ROUTINE TESTING W REFLEX): HIV Screen 4th Generation wRfx: NONREACTIVE

## 2014-09-12 LAB — RPR: RPR: REACTIVE — AB

## 2014-09-12 LAB — PMP SCREEN PROFILE (10S), URINE
AMPHETAMINE SCRN UR: NEGATIVE ng/mL
BARBITURATE SCRN UR: NEGATIVE ng/mL
BENZODIAZEPINE SCREEN, URINE: NEGATIVE ng/mL
CANNABINOIDS UR QL SCN: NEGATIVE ng/mL
COCAINE(METAB.) SCREEN, URINE: NEGATIVE ng/mL
CREATININE(CRT), U: 207.3 mg/dL (ref 20.0–300.0)
METHADONE SCREEN, URINE: NEGATIVE ng/mL
OPIATE SCRN UR: NEGATIVE ng/mL
Oxycodone+Oxymorphone Ur Ql Scn: NEGATIVE ng/mL
PCP SCRN UR: NEGATIVE ng/mL
PH UR, DRUG SCRN: 6.1 (ref 4.5–8.9)
PROPOXYPHENE SCREEN: NEGATIVE ng/mL

## 2014-09-12 LAB — VARICELLA ZOSTER ANTIBODY, IGG: Varicella zoster IgG: 599 index (ref >165)

## 2014-09-12 LAB — RPR, QUANT+TP ABS (REFLEX)
Rapid Plasma Reagin, Quant: 1:1 {titer} — ABNORMAL HIGH
T Pallidum Abs: NEGATIVE

## 2014-09-12 LAB — RUBELLA SCREEN: Rubella Antibodies, IGG: 7.38 index (ref >0.99)

## 2014-09-12 LAB — AB SCR+ANTIBODY ID: ANTIBODY SCREEN: POSITIVE — AB

## 2014-09-15 LAB — CYTOLOGY - PAP

## 2014-09-15 LAB — SPECIMEN STATUS REPORT

## 2014-09-17 ENCOUNTER — Other Ambulatory Visit: Payer: Self-pay | Admitting: Advanced Practice Midwife

## 2014-09-17 DIAGNOSIS — Z0184 Encounter for antibody response examination: Secondary | ICD-10-CM

## 2014-09-17 NOTE — Progress Notes (Signed)
Ab screen ordered. [facility] and [facility].

## 2014-09-23 ENCOUNTER — Encounter: Payer: Self-pay | Admitting: Women's Health

## 2014-09-23 ENCOUNTER — Other Ambulatory Visit: Payer: Self-pay | Admitting: *Deleted

## 2014-09-23 ENCOUNTER — Ambulatory Visit (INDEPENDENT_AMBULATORY_CARE_PROVIDER_SITE_OTHER): Payer: Medicaid Other

## 2014-09-23 ENCOUNTER — Other Ambulatory Visit: Payer: Self-pay | Admitting: Advanced Practice Midwife

## 2014-09-23 ENCOUNTER — Ambulatory Visit (INDEPENDENT_AMBULATORY_CARE_PROVIDER_SITE_OTHER): Payer: Medicaid Other | Admitting: Women's Health

## 2014-09-23 VITALS — BP 120/60 | Wt 226.0 lb

## 2014-09-23 DIAGNOSIS — O0930 Supervision of pregnancy with insufficient antenatal care, unspecified trimester: Secondary | ICD-10-CM

## 2014-09-23 DIAGNOSIS — Z331 Pregnant state, incidental: Secondary | ICD-10-CM

## 2014-09-23 DIAGNOSIS — Z8759 Personal history of other complications of pregnancy, childbirth and the puerperium: Secondary | ICD-10-CM | POA: Insufficient documentation

## 2014-09-23 DIAGNOSIS — Z3492 Encounter for supervision of normal pregnancy, unspecified, second trimester: Secondary | ICD-10-CM

## 2014-09-23 DIAGNOSIS — R768 Other specified abnormal immunological findings in serum: Secondary | ICD-10-CM

## 2014-09-23 DIAGNOSIS — Z363 Encounter for antenatal screening for malformations: Secondary | ICD-10-CM

## 2014-09-23 DIAGNOSIS — Z0184 Encounter for antibody response examination: Secondary | ICD-10-CM

## 2014-09-23 DIAGNOSIS — O09292 Supervision of pregnancy with other poor reproductive or obstetric history, second trimester: Secondary | ICD-10-CM

## 2014-09-23 DIAGNOSIS — Z36 Encounter for antenatal screening of mother: Secondary | ICD-10-CM

## 2014-09-23 DIAGNOSIS — E039 Hypothyroidism, unspecified: Secondary | ICD-10-CM

## 2014-09-23 DIAGNOSIS — Z1389 Encounter for screening for other disorder: Secondary | ICD-10-CM

## 2014-09-23 LAB — POCT URINALYSIS DIPSTICK
Blood, UA: NEGATIVE
GLUCOSE UA: NEGATIVE
KETONES UA: NEGATIVE
LEUKOCYTES UA: NEGATIVE
Nitrite, UA: NEGATIVE

## 2014-09-23 NOTE — Progress Notes (Signed)
Low-risk OB appointment [id number] Estimated Date of Delivery: 02/15/15 BP 120/60 mmHg  Wt 226 lb (102.513 kg)  BP, weight, and urine reviewed.  Refer to obstetrical flow sheet for FH & FHR.  Reports good fm.  Denies regular uc's, lof, vb, or uti s/s. Lt sided RLP- discussed prevention measures. Still BF 31yo.  Reviewed today's normal anatomy u/s.  Plan:  Continue routine obstetrical care. Going to [facility] when she leaves to have antibody screen repeated- previously set up.  F/U in 4wks for OB appointment

## 2014-09-23 NOTE — Patient Instructions (Signed)
Second Trimester of Pregnancy The second trimester is from week 13 through week 28, months 4 through 6. The second trimester is often a time when you feel your best. Your body has also adjusted to being pregnant, and you begin to feel better physically. Usually, morning sickness has lessened or quit completely, you may have more energy, and you may have an increase in appetite. The second trimester is also a time when the fetus is growing rapidly. At the end of the sixth month, the fetus is about 9 inches long and weighs about 1 pounds. You will likely begin to feel the baby move (quickening) between 18 and 20 weeks of the pregnancy. BODY CHANGES Your body goes through many changes during pregnancy. The changes vary from woman to woman.   Your weight will continue to increase. You will notice your lower abdomen bulging out.  You may begin to get stretch marks on your hips, abdomen, and breasts.  You may develop headaches that can be relieved by medicines approved by your health care provider.  You may urinate more often because the fetus is pressing on your bladder.  You may develop or continue to have heartburn as a result of your pregnancy.  You may develop constipation because certain hormones are causing the muscles that push waste through your intestines to slow down.  You may develop hemorrhoids or swollen, bulging veins (varicose veins).  You may have back pain because of the weight gain and pregnancy hormones relaxing your joints between the bones in your pelvis and as a result of a shift in weight and the muscles that support your balance.  Your breasts will continue to grow and be tender.  Your gums may bleed and may be sensitive to brushing and flossing.  Dark spots or blotches (chloasma, mask of pregnancy) may develop on your face. This will likely fade after the baby is born.  A dark line from your belly button to the pubic area (linea nigra) may appear. This will likely fade  after the baby is born.  You may have changes in your hair. These can include thickening of your hair, rapid growth, and changes in texture. Some women also have hair loss during or after pregnancy, or hair that feels dry or thin. Your hair will most likely return to normal after your baby is born. WHAT TO EXPECT AT YOUR PRENATAL VISITS During a routine prenatal visit:  You will be weighed to make sure you and the fetus are growing normally.  Your blood pressure will be taken.  Your abdomen will be measured to track your baby's growth.  The fetal heartbeat will be listened to.  Any test results from the previous visit will be discussed. Your health care provider may ask you:  How you are feeling.  If you are feeling the baby move.  If you have had any abnormal symptoms, such as leaking fluid, bleeding, severe headaches, or abdominal cramping.  If you have any questions. Other tests that may be performed during your second trimester include:  Blood tests that check for:  Low iron levels (anemia).  Gestational diabetes (between 24 and 28 weeks).  Rh antibodies.  Urine tests to check for infections, diabetes, or protein in the urine.  An ultrasound to confirm the proper growth and development of the baby.  An amniocentesis to check for possible genetic problems.  Fetal screens for spina bifida and Down syndrome. HOME CARE INSTRUCTIONS   Avoid all smoking, herbs, alcohol, and unprescribed   drugs. These chemicals affect the formation and growth of the baby.  Follow your health care provider's instructions regarding medicine use. There are medicines that are either safe or unsafe to take during pregnancy.  Exercise only as directed by your health care provider. Experiencing uterine cramps is a good sign to stop exercising.  Continue to eat regular, healthy meals.  Wear a good support bra for breast tenderness.  Do not use hot tubs, steam rooms, or saunas.  Wear your  seat belt at all times when driving.  Avoid raw meat, uncooked cheese, cat litter boxes, and soil used by cats. These carry germs that can cause birth defects in the baby.  Take your prenatal vitamins.  Try taking a stool softener (if your health care provider approves) if you develop constipation. Eat more high-fiber foods, such as fresh vegetables or fruit and whole grains. Drink plenty of fluids to keep your urine clear or pale yellow.  Take warm sitz baths to soothe any pain or discomfort caused by hemorrhoids. Use hemorrhoid cream if your health care provider approves.  If you develop varicose veins, wear support hose. Elevate your feet for 15 minutes, 3-4 times a day. Limit salt in your diet.  Avoid heavy lifting, wear low heel shoes, and practice good posture.  Rest with your legs elevated if you have leg cramps or low back pain.  Visit your dentist if you have not gone yet during your pregnancy. Use a soft toothbrush to brush your teeth and be gentle when you floss.  A sexual relationship may be continued unless your health care provider directs you otherwise.  Continue to go to all your prenatal visits as directed by your health care provider. SEEK MEDICAL CARE IF:   You have dizziness.  You have mild pelvic cramps, pelvic pressure, or nagging pain in the abdominal area.  You have persistent nausea, vomiting, or diarrhea.  You have a bad smelling vaginal discharge.  You have pain with urination. SEEK IMMEDIATE MEDICAL CARE IF:   You have a fever.  You are leaking fluid from your vagina.  You have spotting or bleeding from your vagina.  You have severe abdominal cramping or pain.  You have rapid weight gain or loss.  You have shortness of breath with chest pain.  You notice sudden or extreme swelling of your face, hands, ankles, feet, or legs.  You have not felt your baby move in over an hour.  You have severe headaches that do not go away with  medicine.  You have vision changes. Document Released: 07/18/2001 Document Revised: 07/29/2013 Document Reviewed: 09/24/2012 Exit[employer] Patient Information 2015 [employer]. This information is not intended to replace advice given to you by your health care provider. Make sure you discuss any questions you have with your health care provider.  

## 2014-09-23 NOTE — Progress Notes (Signed)
U/S(19+2wks)-single active fetus, meas c/w dates, fluid WNL, posterior Gr 0 placenta, cx appears closed (3.4cm), bilateral adnexa appears WNL, no major abnl noted, female fetus

## 2014-10-21 ENCOUNTER — Ambulatory Visit (INDEPENDENT_AMBULATORY_CARE_PROVIDER_SITE_OTHER): Payer: Medicaid Other | Admitting: Advanced Practice Midwife

## 2014-10-21 VITALS — BP 100/70 | HR 72 | Temp 98.5°F | Wt 232.0 lb

## 2014-10-21 DIAGNOSIS — Z331 Pregnant state, incidental: Secondary | ICD-10-CM

## 2014-10-21 DIAGNOSIS — Z3492 Encounter for supervision of normal pregnancy, unspecified, second trimester: Secondary | ICD-10-CM

## 2014-10-21 DIAGNOSIS — Z1389 Encounter for screening for other disorder: Secondary | ICD-10-CM

## 2014-10-21 LAB — POCT URINALYSIS DIPSTICK
Glucose, UA: NEGATIVE
KETONES UA: NEGATIVE
Leukocytes, UA: NEGATIVE
Nitrite, UA: NEGATIVE

## 2014-10-21 MED ORDER — AZITHROMYCIN 250 MG PO TABS: 250.0000 mg | ORAL_TABLET | Freq: Every day | ORAL | Status: DC

## 2014-10-21 NOTE — Progress Notes (Signed)
[id number] 5955w2d Estimated Date of Delivery: 02/15/15  Blood pressure 100/70, pulse 72, temperature 98.5 F (36.9 C), weight 232 lb (105.235 kg), currently breastfeeding.   BP weight and urine results all reviewed and noted.  Please refer to the obstetrical flow sheet for the fundal height and fetal heart rate documentation  Patient reports good fetal movement, denies any bleeding and no rupture of membranes symptoms or regular contractions. Patient c/o runny nose, sore throat, productive cough for 10 days All questions were answered.  Plan:  Continued routine obstetrical care, zpack  Follow up in 3 weeks for OB appointment, PN2

## 2014-10-21 NOTE — Patient Instructions (Signed)

## 2014-10-25 ENCOUNTER — Inpatient Hospital Stay (HOSPITAL_COMMUNITY)
Admission: AD | Admit: 2014-10-25 | Discharge: 2014-10-25 | Disposition: A | Discharge status: Home / Self Care | Payer: Medicaid Other | Source: Ambulatory Visit | Attending: Obstetrics and Gynecology | Admitting: Obstetrics and Gynecology

## 2014-10-25 ENCOUNTER — Encounter (HOSPITAL_COMMUNITY): Payer: Self-pay | Admitting: *Deleted

## 2014-10-25 DIAGNOSIS — Z3A23 23 weeks gestation of pregnancy: Secondary | ICD-10-CM | POA: Diagnosis not present

## 2014-10-25 DIAGNOSIS — O9989 Other specified diseases and conditions complicating pregnancy, childbirth and the puerperium: Secondary | ICD-10-CM | POA: Diagnosis not present

## 2014-10-25 DIAGNOSIS — O212 Late vomiting of pregnancy: Secondary | ICD-10-CM | POA: Diagnosis present

## 2014-10-25 DIAGNOSIS — K529 Noninfective gastroenteritis and colitis, unspecified: Secondary | ICD-10-CM | POA: Diagnosis not present

## 2014-10-25 DIAGNOSIS — Z87891 Personal history of nicotine dependence: Secondary | ICD-10-CM | POA: Insufficient documentation

## 2014-10-25 LAB — URINALYSIS, ROUTINE W REFLEX MICROSCOPIC
Glucose, UA: NEGATIVE mg/dL
Hgb urine dipstick: NEGATIVE
Ketones, ur: 40 mg/dL — AB
Leukocytes, UA: NEGATIVE
Nitrite: NEGATIVE
PH: 6 (ref 5.0–8.0)
Protein, ur: 30 mg/dL — AB
SPECIFIC GRAVITY, URINE: 1.025 (ref 1.005–1.030)
Urobilinogen, UA: 0.2 mg/dL (ref 0.0–1.0)

## 2014-10-25 LAB — URINE MICROSCOPIC-ADD ON

## 2014-10-25 MED ORDER — ONDANSETRON 8 MG PO TBDP
8.0000 mg | ORAL_TABLET | Freq: Once | ORAL | Status: AC
  Administered 2014-10-25: 8 mg via ORAL
  Filled 2014-10-25: qty 1

## 2014-10-25 MED ORDER — LACTATED RINGERS IV BOLUS (SEPSIS)
1000.0000 mL | Freq: Once | INTRAVENOUS | Status: AC
  Administered 2014-10-25: 1000 mL via INTRAVENOUS

## 2014-10-25 MED ORDER — ONDANSETRON 8 MG PO TBDP: 8.0000 mg | ORAL_TABLET | Freq: Three times a day (TID) | ORAL | Status: DC | PRN

## 2014-10-25 NOTE — MAU Provider Note (Signed)
History     CSN: [id number]  Arrival date and time: 10/25/14 2114   First Provider Initiated Contact with Patient 10/25/14 2210      Chief Complaint  Patient presents with  . Emesis During Pregnancy  . Dizziness   HPI Marie Murphy is a 29yo G4 P1021 at 23weeks and 6 days presenting for vomiting and diarrhea since this morning. States diarrhea has resolved, however she has continued to vomit with PO intake. Admits to one sick contact, stating there was a sick child she had been around once recently who had been vomiting. Denies fever. Denies abdominal pain. Denies LOF. Denies vaginal bleeding or discharge. Continues to note fetal movement. OB History    Gravida Para Term Preterm AB TAB SAB Ectopic Multiple Living   Past Medical History  Diagnosis Date  . Headache(784.0)   . Arrhythmia   . Ovarian cyst   . Hypothyroidism     no meds since 2012    Past Surgical History  Procedure Laterality Date  . Tonsillectomy    . Dilation and curettage of uterus      Family History  Problem Relation Age of Onset  . Diabetes Mother   . Hypertension Mother   . Bipolar disorder Mother   . Thyroid disease Mother   . Cancer Maternal Aunt     ovarian  . Diabetes Maternal Aunt   . Diabetes Maternal Uncle   . Stroke Maternal Uncle   . Coronary artery disease Maternal Uncle   . Thyroid disease Maternal Uncle   . Thyroid disease Paternal Aunt   . Cancer Maternal Grandmother   . Diabetes Maternal Grandmother   . Hypertension Maternal Grandmother   . Stroke Maternal Grandmother   . Thyroid disease Maternal Grandmother     History  Substance Use Topics  . Smoking status: Former Smoker -- 1.00 packs/day    Types: Cigarettes    Quit date: 05/24/2012  . Smokeless tobacco: Never Used     Comment: down to 1 cigarette a day  . Alcohol Use: No     Comment: None since + preg Test    Allergies: No Known Allergies  Prescriptions prior to admission  Medication  Sig Dispense Refill Last Dose  . acetaminophen (TYLENOL) 500 MG chewable tablet Chew 500 mg by mouth every 6 (six) hours as needed for pain.   Taking  . amLODipine (NORVASC) 5 MG tablet Take 1 tablet (5 mg total) by mouth daily. (Patient not taking: Reported on 09/10/2014) 30 tablet 0 Not Taking  . azithromycin (ZITHROMAX) 250 MG tablet Take 1 tablet (250 mg total) by mouth daily. Take 2 today, the 1 a day for 4 days 6 tablet 0   . Prenatal Multivit-Min-Fe-FA (PRENATAL VITAMINS) 0.8 MG tablet Take 1 tablet by mouth daily. 30 tablet 12 Taking  . Prenatal Vit-Fe Fumarate-FA (PRENATAL MULTIVITAMIN) TABS Take 1 tablet by mouth daily at 12 noon.   Not Taking  . Pseudoephedrine-APAP-DM (TYLENOL COLD/FLU SEVERE DAY PO) Take 1 tablet by mouth every 4 (four) hours.   Taking    Review of Systems  Constitutional: Negative for fever.  Gastrointestinal: Positive for nausea, vomiting and diarrhea. Negative for abdominal pain.   Physical Exam   Blood pressure 130/76, pulse 99, temperature 98.6 F (37 C), temperature source Oral, resp. rate 18, currently breastfeeding.  Physical Exam  Constitutional: She appears well-developed and well-nourished. No distress.  Cardiovascular: Normal  rate.  Exam reveals no gallop and no friction rub.   No murmur heard. Respiratory: Effort normal. No respiratory distress. She has no wheezes. She has no rales.  GI: Soft. There is no tenderness.  Musculoskeletal: She exhibits no edema.  Psychiatric: She has a normal mood and affect. Her behavior is normal.   MAU Course  Procedures  MDM Urinalysis showed positive ketones. Fetal Monitor: baseline 150bpm, moderate variability, + accelerations LR 1L bolus Zofran ODT 8mg  Tolerated trial of food.  Assessment and Plan  Gastroenteritis: - Stable for discharge. Prescription of Zofran ODT sent to pharmacy. Handout given on Gastroenteritis.   Araceli BoucheRumley, [place] 10/25/2014, 10:10 PM

## 2014-10-25 NOTE — MAU Note (Signed)
Vomiting & diarrhea since last night. Denies fever/abdominal pain/LOF/vaginal bleeding.

## 2014-10-25 NOTE — Discharge Instructions (Signed)

## 2014-10-26 ENCOUNTER — Other Ambulatory Visit: Payer: Self-pay | Admitting: Women's Health

## 2014-10-26 ENCOUNTER — Encounter: Payer: Self-pay | Admitting: Women's Health

## 2014-10-26 ENCOUNTER — Telehealth: Payer: Self-pay | Admitting: Women's Health

## 2014-10-26 DIAGNOSIS — O361921 Maternal care for other isoimmunization, second trimester, fetus 1: Secondary | ICD-10-CM

## 2014-10-26 DIAGNOSIS — O361990 Maternal care for other isoimmunization, unspecified trimester, not applicable or unspecified: Secondary | ICD-10-CM | POA: Insufficient documentation

## 2014-10-26 NOTE — Telephone Encounter (Signed)
Notified of +warm auto-antibodies and need for u/s q 4wks- questions answered, states she needs to reschedule next appt to different day than tues- so switched up front.  Cheral MarkerKimberly R. Joscelyn Hardrick, CNM, [facility]WHNP-BC 10/26/2014 1:47 PM

## 2014-11-17 ENCOUNTER — Other Ambulatory Visit: Payer: Medicaid Other

## 2014-11-17 ENCOUNTER — Encounter: Payer: Medicaid Other | Admitting: Women's Health

## 2014-11-17 ENCOUNTER — Encounter: Payer: Medicaid Other | Admitting: Advanced Practice Midwife

## 2014-11-19 ENCOUNTER — Encounter: Payer: Self-pay | Admitting: Advanced Practice Midwife

## 2014-11-19 ENCOUNTER — Other Ambulatory Visit: Payer: Self-pay | Admitting: Women's Health

## 2014-11-19 ENCOUNTER — Ambulatory Visit (INDEPENDENT_AMBULATORY_CARE_PROVIDER_SITE_OTHER): Payer: Medicaid Other | Admitting: Advanced Practice Midwife

## 2014-11-19 ENCOUNTER — Other Ambulatory Visit: Payer: Medicaid Other

## 2014-11-19 ENCOUNTER — Ambulatory Visit (INDEPENDENT_AMBULATORY_CARE_PROVIDER_SITE_OTHER): Payer: Medicaid Other

## 2014-11-19 ENCOUNTER — Telehealth: Payer: Self-pay | Admitting: Advanced Practice Midwife

## 2014-11-19 VITALS — BP 118/66 | HR 80 | Wt 237.0 lb

## 2014-11-19 DIAGNOSIS — Z3492 Encounter for supervision of normal pregnancy, unspecified, second trimester: Secondary | ICD-10-CM

## 2014-11-19 DIAGNOSIS — Z131 Encounter for screening for diabetes mellitus: Secondary | ICD-10-CM

## 2014-11-19 DIAGNOSIS — O0932 Supervision of pregnancy with insufficient antenatal care, second trimester: Secondary | ICD-10-CM

## 2014-11-19 DIAGNOSIS — Z369 Encounter for antenatal screening, unspecified: Secondary | ICD-10-CM

## 2014-11-19 DIAGNOSIS — Z1389 Encounter for screening for other disorder: Secondary | ICD-10-CM

## 2014-11-19 DIAGNOSIS — D591 Other autoimmune hemolytic anemias: Secondary | ICD-10-CM

## 2014-11-19 DIAGNOSIS — O361921 Maternal care for other isoimmunization, second trimester, fetus 1: Secondary | ICD-10-CM | POA: Diagnosis not present

## 2014-11-19 DIAGNOSIS — Z331 Pregnant state, incidental: Secondary | ICD-10-CM

## 2014-11-19 DIAGNOSIS — D5911 Warm autoimmune hemolytic anemia: Secondary | ICD-10-CM

## 2014-11-19 LAB — POCT URINALYSIS DIPSTICK
GLUCOSE UA: NEGATIVE
KETONES UA: NEGATIVE
Leukocytes, UA: NEGATIVE
Nitrite, UA: NEGATIVE
RBC UA: NEGATIVE

## 2014-11-19 NOTE — Progress Notes (Signed)
US [redacted]W[redacted]D C/W dates,efw 1205g,64%,ant pl grade 1,cephalic,normal ov's bilat,fht 130bpm,afi 14.5cm

## 2014-11-19 NOTE — Progress Notes (Addendum)
[id number] 6160w3d Estimated Date of Delivery: 02/15/15  Blood pressure 118/66, pulse 80, weight 237 lb (107.502 kg), currently breastfeeding.   BP weight and urine results all reviewed and noted.  Please refer to the obstetrical flow sheet for the fundal height and fetal heart rate documentation: Had US today:   US [redacted]W[redacted]D C/W dates,efw 1205g,64%,ant pl grade 1,cephalic,normal ov's bilat,fht 130bpm,afi 14.5cm  Patient reports good fetal movement, denies any bleeding and no rupture of membranes symptoms or regular contractions. Patient is without complaints. All questions were answered. Discussed warm antibodies/hemolytic fetal anemia.  No know prophylactic tx Plan:  Continued routine obstetrical care, US q 4 weeks Warm antibodies:  Growth/s/s of fetal anemai (ascites, etc).   PN2 today  **[facility]  Follow up in 4 weeks for OB appointment, [place]

## 2014-11-19 NOTE — Progress Notes (Signed)
Pt wants to discuss her blood work with you.

## 2014-11-21 LAB — GLUCOSE TOLERANCE, 2 HOURS W/ 1HR
GLUCOSE, 2 HOUR: 61 mg/dL — AB (ref 65–152)
GLUCOSE, FASTING: 81 mg/dL (ref 65–91)
Glucose, 1 hour: 110 mg/dL (ref 65–179)

## 2014-11-21 LAB — CBC
HCT: 34.3 % (ref 34.0–46.6)
Hemoglobin: 11.5 g/dL (ref 11.1–15.9)
MCH: 30.7 pg (ref 26.6–33.0)
MCHC: 33.5 g/dL (ref 31.5–35.7)
MCV: 92 fL (ref 79–97)
PLATELETS: 183 10*3/uL (ref 150–379)
RBC: 3.74 x10E6/uL — ABNORMAL LOW (ref 3.77–5.28)
RDW: 12.7 % (ref 12.3–15.4)
WBC: 5.5 10*3/uL (ref 3.4–10.8)

## 2014-11-21 LAB — HIV ANTIBODY (ROUTINE TESTING W REFLEX): HIV SCREEN 4TH GENERATION: NONREACTIVE

## 2014-11-21 LAB — ANTIBODY SCREEN

## 2014-11-21 LAB — AB SCR+ANTIBODY ID: ANTIBODY SCREEN: POSITIVE — AB

## 2014-11-21 LAB — RPR: RPR Ser Ql: REACTIVE — AB

## 2014-11-21 LAB — RPR, QUANT+TP ABS (REFLEX)
Rapid Plasma Reagin, Quant: 1:2 {titer} — ABNORMAL HIGH
T Pallidum Abs: NEGATIVE

## 2014-11-21 LAB — HSV 2 ANTIBODY, IGG: HSV 2 Glycoprotein G Ab, IgG: <0.91 index (ref 0.00–0.90)

## 2014-11-25 LAB — OB RESULTS CONSOLE HIV ANTIBODY (ROUTINE TESTING): HIV: NONREACTIVE

## 2014-12-17 ENCOUNTER — Ambulatory Visit (INDEPENDENT_AMBULATORY_CARE_PROVIDER_SITE_OTHER): Payer: Medicaid Other

## 2014-12-17 ENCOUNTER — Encounter: Payer: Self-pay | Admitting: Advanced Practice Midwife

## 2014-12-17 ENCOUNTER — Ambulatory Visit (INDEPENDENT_AMBULATORY_CARE_PROVIDER_SITE_OTHER): Payer: Medicaid Other | Admitting: Advanced Practice Midwife

## 2014-12-17 VITALS — BP 128/68 | HR 72 | Wt 240.0 lb

## 2014-12-17 DIAGNOSIS — Z3493 Encounter for supervision of normal pregnancy, unspecified, third trimester: Secondary | ICD-10-CM

## 2014-12-17 DIAGNOSIS — O0932 Supervision of pregnancy with insufficient antenatal care, second trimester: Secondary | ICD-10-CM

## 2014-12-17 DIAGNOSIS — Z1389 Encounter for screening for other disorder: Secondary | ICD-10-CM

## 2014-12-17 DIAGNOSIS — O361923 Maternal care for other isoimmunization, second trimester, fetus 3: Secondary | ICD-10-CM

## 2014-12-17 DIAGNOSIS — D591 Other autoimmune hemolytic anemias: Secondary | ICD-10-CM

## 2014-12-17 DIAGNOSIS — D5911 Warm autoimmune hemolytic anemia: Secondary | ICD-10-CM

## 2014-12-17 DIAGNOSIS — Z331 Pregnant state, incidental: Secondary | ICD-10-CM

## 2014-12-17 DIAGNOSIS — O361931 Maternal care for other isoimmunization, third trimester, fetus 1: Secondary | ICD-10-CM

## 2014-12-17 LAB — POCT URINALYSIS DIPSTICK
Blood, UA: NEGATIVE
Glucose, UA: NEGATIVE
Ketones, UA: NEGATIVE
Leukocytes, UA: NEGATIVE
Nitrite, UA: NEGATIVE
PROTEIN UA: NEGATIVE

## 2014-12-17 MED ORDER — AZITHROMYCIN 250 MG PO TABS: 250.0000 mg | ORAL_TABLET | Freq: Every day | ORAL | Status: DC

## 2014-12-17 NOTE — Progress Notes (Signed)
[id number] 7189w3d Estimated Date of Delivery: 02/15/15  currently breastfeeding.   BP weight and urine results all reviewed and noted.  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  Complement still +. Probably a cross reaction of some sort.    Patient reports good fetal movement, denies any bleeding and no rupture of membranes symptoms or regular contractions. Patient c/o of sneezing, cough for about 2 weeks.  All questions were answered.  Plan:  Continued routine obstetrical care, will continue q 4 week US to r/o hydrops/growth problems.   Follow up in 2 weeks for OB appointment,

## 2014-12-17 NOTE — Progress Notes (Signed)
US [redacted]W[redacted]D measurements c/w dates, EFW 2021g 65.7% ,normal ovs,ant pl gr 1 , fht 151bpm,afi 14.9cm,cephalic

## 2014-12-25 ENCOUNTER — Encounter: Payer: Self-pay | Admitting: Advanced Practice Midwife

## 2014-12-31 ENCOUNTER — Ambulatory Visit (INDEPENDENT_AMBULATORY_CARE_PROVIDER_SITE_OTHER): Payer: Medicaid Other | Admitting: Obstetrics & Gynecology

## 2014-12-31 ENCOUNTER — Encounter: Payer: Medicaid Other | Admitting: Obstetrics & Gynecology

## 2014-12-31 ENCOUNTER — Encounter: Payer: Self-pay | Admitting: Obstetrics & Gynecology

## 2014-12-31 VITALS — BP 100/60 | HR 72 | Wt 243.4 lb

## 2014-12-31 DIAGNOSIS — D5911 Warm autoimmune hemolytic anemia: Secondary | ICD-10-CM

## 2014-12-31 DIAGNOSIS — Z331 Pregnant state, incidental: Secondary | ICD-10-CM

## 2014-12-31 DIAGNOSIS — D591 Other autoimmune hemolytic anemias: Principal | ICD-10-CM

## 2014-12-31 DIAGNOSIS — Z3493 Encounter for supervision of normal pregnancy, unspecified, third trimester: Secondary | ICD-10-CM

## 2014-12-31 LAB — POCT URINALYSIS DIPSTICK
GLUCOSE UA: NEGATIVE
Ketones, UA: NEGATIVE
Leukocytes, UA: NEGATIVE
NITRITE UA: NEGATIVE
RBC UA: NEGATIVE

## 2014-12-31 NOTE — Addendum Note (Signed)
Addended by: Criss AlvinePULLIAM, CHRYSTAL G on: 12/31/2014 02:17 PM   Modules accepted: Orders

## 2014-12-31 NOTE — Progress Notes (Signed)
[id number] 8448w3d Estimated Date of Delivery: 02/15/15  Blood pressure 100/60, pulse 72, weight 243 lb 6.4 oz (110.406 kg), currently breastfeeding.   BP weight and urine results all reviewed and noted.  Please refer to the obstetrical flow sheet for the fundal height and fetal heart rate documentation:  Patient reports good fetal movement, denies any bleeding and no rupture of membranes symptoms or regular contractions. Patient is without complaints. All questions were answered.  Plan:  Continued routine obstetrical care,   Follow up in 2 weeks for OB appointment, [facility], ordered today  Sonogram in 2 weeks for fetal evalaution

## 2015-01-05 ENCOUNTER — Other Ambulatory Visit (HOSPITAL_COMMUNITY)
Admission: AD | Admit: 2015-01-05 | Discharge: 2015-01-05 | Disposition: A | Discharge status: Home / Self Care | Payer: Medicaid Other | Source: Ambulatory Visit | Attending: Obstetrics & Gynecology | Admitting: Obstetrics & Gynecology

## 2015-01-05 DIAGNOSIS — Z0183 Encounter for blood typing: Secondary | ICD-10-CM | POA: Insufficient documentation

## 2015-01-05 LAB — TYPE AND SCREEN
ABO/RH(D): O POS
Antibody Screen: POSITIVE
DAT, IgG: NEGATIVE
DAT, complement: POSITIVE

## 2015-01-14 ENCOUNTER — Ambulatory Visit (INDEPENDENT_AMBULATORY_CARE_PROVIDER_SITE_OTHER): Payer: Medicaid Other | Admitting: Obstetrics and Gynecology

## 2015-01-14 ENCOUNTER — Ambulatory Visit (INDEPENDENT_AMBULATORY_CARE_PROVIDER_SITE_OTHER): Payer: Medicaid Other

## 2015-01-14 VITALS — BP 106/66 | HR 72 | Wt 247.2 lb

## 2015-01-14 DIAGNOSIS — D591 Other autoimmune hemolytic anemias: Secondary | ICD-10-CM

## 2015-01-14 DIAGNOSIS — O0932 Supervision of pregnancy with insufficient antenatal care, second trimester: Secondary | ICD-10-CM

## 2015-01-14 DIAGNOSIS — Z1389 Encounter for screening for other disorder: Secondary | ICD-10-CM

## 2015-01-14 DIAGNOSIS — Z3493 Encounter for supervision of normal pregnancy, unspecified, third trimester: Secondary | ICD-10-CM

## 2015-01-14 DIAGNOSIS — O361923 Maternal care for other isoimmunization, second trimester, fetus 3: Secondary | ICD-10-CM

## 2015-01-14 DIAGNOSIS — D5911 Warm autoimmune hemolytic anemia: Secondary | ICD-10-CM

## 2015-01-14 DIAGNOSIS — Z331 Pregnant state, incidental: Secondary | ICD-10-CM

## 2015-01-14 LAB — POCT URINALYSIS DIPSTICK
GLUCOSE UA: NEGATIVE
Ketones, UA: NEGATIVE
LEUKOCYTES UA: NEGATIVE
Nitrite, UA: NEGATIVE

## 2015-01-14 NOTE — Progress Notes (Signed)
Patient ID: Marie Murphy, female   DOB: 05-27-1986, 29 y.o.   MRN: [id number]  [id number] [redacted]w[redacted]d Estimated Date of Delivery: 02/15/15  Blood pressure 106/66, pulse 72, weight 247 lb 3.2 oz (112.129 kg), currently breastfeeding.   refer to the ob flow sheet for FH and FHR, also BP, Wt, Urine results:notable for negative  Patient reports + good fetal movement, denies any bleeding and no rupture of membranes symptoms or regular contractions. Patient complaints: None  FH: 36 cm FHR: 145 bpm  Questions were answered. Assessment: Pregnancy [redacted]w[redacted]d, LROB.                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 This patient was seen in room 2 and the patient's care was started at 11:22 AM.   I personally performed the services described in this documentation, which was SCRIBED in my presence. The recorded information has been reviewed and considered accurate. It has been edited as necessary during review. Tilda Burrow, MD

## 2015-01-14 NOTE — Progress Notes (Signed)
[place] 35+3wks measurements c/w dates efw 2772g 53.6%,cephalic,post pl gr 1,afi 13.8cm,normal rt ov,unable to see lt ov, fht 145bpm

## 2015-01-15 ENCOUNTER — Other Ambulatory Visit: Payer: Medicaid Other

## 2015-01-29 ENCOUNTER — Ambulatory Visit (INDEPENDENT_AMBULATORY_CARE_PROVIDER_SITE_OTHER): Payer: Medicaid Other | Admitting: Obstetrics and Gynecology

## 2015-01-29 ENCOUNTER — Encounter: Payer: Self-pay | Admitting: Obstetrics and Gynecology

## 2015-01-29 VITALS — BP 100/66 | HR 96 | Wt 247.5 lb

## 2015-01-29 DIAGNOSIS — D591 Other autoimmune hemolytic anemias: Secondary | ICD-10-CM

## 2015-01-29 DIAGNOSIS — Z369 Encounter for antenatal screening, unspecified: Secondary | ICD-10-CM

## 2015-01-29 DIAGNOSIS — Z1389 Encounter for screening for other disorder: Secondary | ICD-10-CM

## 2015-01-29 DIAGNOSIS — D5911 Warm autoimmune hemolytic anemia: Secondary | ICD-10-CM

## 2015-01-29 DIAGNOSIS — Z36 Encounter for antenatal screening of mother: Secondary | ICD-10-CM

## 2015-01-29 DIAGNOSIS — Z331 Pregnant state, incidental: Secondary | ICD-10-CM

## 2015-01-29 LAB — POCT URINALYSIS DIPSTICK
GLUCOSE UA: NEGATIVE
KETONES UA: NEGATIVE
Leukocytes, UA: NEGATIVE
Nitrite, UA: NEGATIVE
RBC UA: NEGATIVE

## 2015-01-29 NOTE — Progress Notes (Signed)
Patient ID: Marie Murphy, female   DOB: 11/27/1985, 29 y.o.   MRN: [id number]  [id number] [redacted]w[redacted]d Estimated Date of Delivery: 02/15/15  Blood pressure 100/66, pulse 96, weight 247 lb 8 oz (112.265 kg), currently breastfeeding.   refer to the ob flow sheet for FH and FHR, also BP, Wt, Urine results:notable for negative  Patient reports  + good fetal movement, denies any bleeding and no rupture of membranes symptoms or regular contractions. Patient complaints: Pt has no complaints today. Her husband is having a vasectomy.  FHR-156 bpm FH-40 cm  Questions were answered.  Assessment:  Pregnancy 104w4d, LROB. [id number]. 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  Plan:  Continued routine obstetrical care.  F/u in 1 weeks for routine pre-natal care   This chart was scribed for Tilda Burrow, MD by Gwenyth Ober, ED Scribe. This patient was seen in room 2 and the patient's care was started at 12:55 PM.   I personally performed the services described in this documentation, which was SCRIBED in my presence. The recorded information has been reviewed and considered accurate. It has been edited as necessary during review. Tilda Burrow, MD

## 2015-01-29 NOTE — Progress Notes (Signed)
Pt denies any problems or concerns at this time.  

## 2015-01-31 LAB — GC/CHLAMYDIA PROBE AMP
Chlamydia trachomatis, NAA: NEGATIVE
NEISSERIA GONORRHOEAE BY PCR: NEGATIVE

## 2015-01-31 LAB — OB RESULTS CONSOLE GBS: STREP GROUP B AG: NEGATIVE

## 2015-01-31 LAB — STREP GP B NAA+RFLX: Strep Gp B NAA+Rflx: NEGATIVE

## 2015-01-31 LAB — OB RESULTS CONSOLE GC/CHLAMYDIA
Chlamydia: NEGATIVE
GC PROBE AMP, GENITAL: NEGATIVE

## 2015-02-02 LAB — CBC
HEMATOCRIT: 37 % (ref 34.0–46.6)
HEMOGLOBIN: 12.9 g/dL (ref 11.1–15.9)
MCH: 31.1 pg (ref 26.6–33.0)
MCHC: 34.9 g/dL (ref 31.5–35.7)
MCV: 89 fL (ref 79–97)
Platelets: 157 10*3/uL (ref 150–379)
RBC: 4.15 x10E6/uL (ref 3.77–5.28)
RDW: 13.3 % (ref 12.3–15.4)
WBC: 6.8 10*3/uL (ref 3.4–10.8)

## 2015-02-02 LAB — LACTATE DEHYDROGENASE: LDH: 148 IU/L (ref 119–226)

## 2015-02-02 LAB — RETICULOCYTES: Retic Ct Pct: 1.7 % (ref 0.6–2.6)

## 2015-02-05 ENCOUNTER — Ambulatory Visit (INDEPENDENT_AMBULATORY_CARE_PROVIDER_SITE_OTHER): Payer: Medicaid Other | Admitting: Obstetrics and Gynecology

## 2015-02-05 VITALS — BP 98/58 | HR 100 | Wt 249.8 lb

## 2015-02-05 DIAGNOSIS — Z331 Pregnant state, incidental: Secondary | ICD-10-CM

## 2015-02-05 DIAGNOSIS — Z1389 Encounter for screening for other disorder: Secondary | ICD-10-CM

## 2015-02-05 DIAGNOSIS — O361930 Maternal care for other isoimmunization, third trimester, not applicable or unspecified: Secondary | ICD-10-CM

## 2015-02-05 LAB — POCT URINALYSIS DIPSTICK
Glucose, UA: NEGATIVE
KETONES UA: NEGATIVE
Leukocytes, UA: NEGATIVE
NITRITE UA: NEGATIVE

## 2015-02-05 NOTE — Progress Notes (Signed)
Patient ID: Marie Murphy, female   DOB: 09/03/1985, 29 y.o.   MRN: [id number]  [id number] 6387w4d Estimated Date of Delivery: 02/15/15  Blood pressure 98/58, pulse 100, weight 249 lb 12.8 oz (113.309 kg), currently breastfeeding.   refer to the ob flow sheet for FH and FHR, also BP, Wt, Urine results:notable for negative  Patient reports  + good fetal movement, denies any bleeding and no rupture of membranes symptoms or regular contractions. Patient complaints: Pt denies any complaints at this time. She has a history of warm antibodies, but most recent lab work showed they disappeared. Pt had lab work on 6/23 which showed no evidence of anemia or hemolysis.  FH-39 cm FHR-142 bpm Edema 1+  Questions were answered. Assessment: 7287w4d; [id number] Plan:  Continued routine obstetrical care. Pt to use the "kick count" method to monitor developing baby.  F/u in 1 week for cervical exam and routine pre-natal care   This chart was scribed for Tilda BurrowJohn Demont Linford V, MD by Gwenyth Oberatherine Macek, ED Scribe. This patient was seen in room 1 and the patient's care was started at 11:18 AM.   I personally performed the services described in this documentation, which was SCRIBED in my presence. The recorded information has been reviewed and considered accurate. It has been edited as necessary during review. Tilda BurrowFERGUSON,Jamae Tison V, MD

## 2015-02-11 ENCOUNTER — Ambulatory Visit (INDEPENDENT_AMBULATORY_CARE_PROVIDER_SITE_OTHER): Payer: Medicaid Other | Admitting: Obstetrics and Gynecology

## 2015-02-11 ENCOUNTER — Encounter: Payer: Self-pay | Admitting: Obstetrics and Gynecology

## 2015-02-11 VITALS — BP 100/60 | HR 88 | Wt 251.0 lb

## 2015-02-11 DIAGNOSIS — Z331 Pregnant state, incidental: Secondary | ICD-10-CM

## 2015-02-11 DIAGNOSIS — Z3493 Encounter for supervision of normal pregnancy, unspecified, third trimester: Secondary | ICD-10-CM

## 2015-02-11 DIAGNOSIS — Z1389 Encounter for screening for other disorder: Secondary | ICD-10-CM

## 2015-02-11 LAB — POCT URINALYSIS DIPSTICK
Blood, UA: NEGATIVE
Glucose, UA: NEGATIVE
Ketones, UA: NEGATIVE
LEUKOCYTES UA: NEGATIVE
Nitrite, UA: NEGATIVE

## 2015-02-11 NOTE — Progress Notes (Signed)
[id number] 5615w3d Estimated Date of Delivery: 02/15/15 LROB, prior warm antibodies, that resolved on retesting, no evidence of hemolysis on testing  Blood pressure 100/60, pulse 88, weight 251 lb (113.853 kg), currently breastfeeding.   refer to the ob flow sheet for FH and FHR, also BP, Wt, Urine results:notable for trace protein  Patient reports   good fetal movement, denies any bleeding and no rupture of membranes symptoms or regular contractions. Patient complaints:has many questions re: membrane stripping, pt is equvocal, and cervix is not favorable, so not done.  Questions were answered. Assessment:  Plan:  Continued routine obstetrical care, IOL if pt reaches 41 wk.  F/u in 1 weeks for cx check, schedule IOL at 39

## 2015-02-11 NOTE — Progress Notes (Signed)
Pt denies any problems or concerns at this time.  

## 2015-02-18 ENCOUNTER — Encounter: Payer: Self-pay | Admitting: Obstetrics & Gynecology

## 2015-02-18 ENCOUNTER — Ambulatory Visit (INDEPENDENT_AMBULATORY_CARE_PROVIDER_SITE_OTHER): Payer: Medicaid Other | Admitting: Obstetrics & Gynecology

## 2015-02-18 VITALS — BP 100/60 | HR 76 | Temp 98.3°F | Wt 250.0 lb

## 2015-02-18 DIAGNOSIS — Z331 Pregnant state, incidental: Secondary | ICD-10-CM

## 2015-02-18 DIAGNOSIS — Z3493 Encounter for supervision of normal pregnancy, unspecified, third trimester: Secondary | ICD-10-CM

## 2015-02-18 DIAGNOSIS — Z1389 Encounter for screening for other disorder: Secondary | ICD-10-CM

## 2015-02-18 LAB — POCT URINALYSIS DIPSTICK
Blood, UA: NEGATIVE
Glucose, UA: NEGATIVE
KETONES UA: NEGATIVE
NITRITE UA: NEGATIVE
Protein, UA: NEGATIVE

## 2015-02-18 NOTE — Progress Notes (Signed)
[id number] 2891w3d Estimated Date of Delivery: 02/15/15  Blood pressure 100/60, pulse 76, temperature 98.3 F (36.8 C), weight 250 lb (113.399 kg), currently breastfeeding.   BP weight and urine results all reviewed and noted.  Please refer to the obstetrical flow sheet for the fundal height and fetal heart rate documentation:  Patient reports good fetal movement, denies any bleeding and no rupture of membranes symptoms or regular contractions. Patient is without complaints. All questions were answered.  Plan:  Continued routine obstetrical care,   Follow up in 6 weeks for OB appointment, pp exam  Induction scheduled 02/22/2015

## 2015-02-19 ENCOUNTER — Telehealth (HOSPITAL_COMMUNITY): Payer: Self-pay | Admitting: *Deleted

## 2015-02-19 ENCOUNTER — Telehealth: Payer: Self-pay | Admitting: *Deleted

## 2015-02-19 ENCOUNTER — Encounter (HOSPITAL_COMMUNITY): Payer: Self-pay | Admitting: *Deleted

## 2015-02-19 MED ORDER — AZITHROMYCIN 250 MG PO TABS: ORAL_TABLET | ORAL | Status: DC

## 2015-02-19 NOTE — Telephone Encounter (Signed)
Preadmission screen  

## 2015-02-19 NOTE — Telephone Encounter (Signed)
Dr. Emelda FearFerguson gave verbal order for Azithromycin 250 mg 2 tablets po day 1, then once daily x 4 days, #6, no refills.

## 2015-02-19 NOTE — Telephone Encounter (Signed)
Pt c/o cough, congestion, sore throat. Requesting z-pack. Pt informed to f/u with pharmacy.

## 2015-02-21 ENCOUNTER — Inpatient Hospital Stay (HOSPITAL_COMMUNITY): Payer: Medicaid Other | Admitting: Anesthesiology

## 2015-02-21 ENCOUNTER — Inpatient Hospital Stay (HOSPITAL_COMMUNITY)
Admission: AD | Admit: 2015-02-21 | Discharge: 2015-02-23 | DRG: 775 | Disposition: A | Discharge status: Home / Self Care | Payer: Medicaid Other | Source: Ambulatory Visit | Attending: Obstetrics and Gynecology | Admitting: Obstetrics and Gynecology

## 2015-02-21 ENCOUNTER — Encounter (HOSPITAL_COMMUNITY): Payer: Self-pay | Admitting: *Deleted

## 2015-02-21 DIAGNOSIS — Z833 Family history of diabetes mellitus: Secondary | ICD-10-CM | POA: Diagnosis not present

## 2015-02-21 DIAGNOSIS — Z87891 Personal history of nicotine dependence: Secondary | ICD-10-CM | POA: Diagnosis not present

## 2015-02-21 DIAGNOSIS — Z8249 Family history of ischemic heart disease and other diseases of the circulatory system: Secondary | ICD-10-CM

## 2015-02-21 DIAGNOSIS — Z6836 Body mass index (BMI) 36.0-36.9, adult: Secondary | ICD-10-CM

## 2015-02-21 DIAGNOSIS — O99214 Obesity complicating childbirth: Principal | ICD-10-CM | POA: Diagnosis present

## 2015-02-21 DIAGNOSIS — Z3A40 40 weeks gestation of pregnancy: Secondary | ICD-10-CM | POA: Diagnosis present

## 2015-02-21 DIAGNOSIS — Z823 Family history of stroke: Secondary | ICD-10-CM | POA: Diagnosis not present

## 2015-02-21 DIAGNOSIS — Z3490 Encounter for supervision of normal pregnancy, unspecified, unspecified trimester: Secondary | ICD-10-CM

## 2015-02-21 LAB — CBC
HEMATOCRIT: 35.5 % — AB (ref 36.0–46.0)
HEMOGLOBIN: 12.5 g/dL (ref 12.0–15.0)
MCH: 32 pg (ref 26.0–34.0)
MCHC: 35.2 g/dL (ref 30.0–36.0)
MCV: 90.8 fL (ref 78.0–100.0)
Platelets: 163 10*3/uL (ref 150–400)
RBC: 3.91 MIL/uL (ref 3.87–5.11)
RDW: 13.3 % (ref 11.5–15.5)
WBC: 9.3 10*3/uL (ref 4.0–10.5)

## 2015-02-21 LAB — PREPARE RBC (CROSSMATCH)

## 2015-02-21 MED ORDER — EPHEDRINE 5 MG/ML INJ
10.0000 mg | INTRAVENOUS | Status: DC | PRN
  Filled 2015-02-21: qty 2

## 2015-02-21 MED ORDER — WITCH HAZEL-GLYCERIN EX PADS: 1.0000 "application " | MEDICATED_PAD | CUTANEOUS | Status: DC | PRN

## 2015-02-21 MED ORDER — SENNOSIDES-DOCUSATE SODIUM 8.6-50 MG PO TABS
2.0000 | ORAL_TABLET | ORAL | Status: DC
  Administered 2015-02-21 – 2015-02-22 (×2): 2 via ORAL
  Filled 2015-02-21 (×2): qty 2

## 2015-02-21 MED ORDER — OXYTOCIN 40 UNITS IN LACTATED RINGERS INFUSION - SIMPLE MED: 62.5000 mL/h | INTRAVENOUS | Status: DC | PRN

## 2015-02-21 MED ORDER — LIDOCAINE HCL (PF) 1 % IJ SOLN
30.0000 mL | INTRAMUSCULAR | Status: DC | PRN
  Filled 2015-02-21: qty 30

## 2015-02-21 MED ORDER — FENTANYL 2.5 MCG/ML BUPIVACAINE 1/10 % EPIDURAL INFUSION (WH - ANES)
INTRAMUSCULAR | Status: DC | PRN
  Administered 2015-02-21: 14 mL/h via EPIDURAL

## 2015-02-21 MED ORDER — SODIUM CHLORIDE 0.9 % IV SOLN: Freq: Once | INTRAVENOUS | Status: DC

## 2015-02-21 MED ORDER — OXYTOCIN BOLUS FROM INFUSION
500.0000 mL | INTRAVENOUS | Status: DC
  Administered 2015-02-21: 500 mL via INTRAVENOUS

## 2015-02-21 MED ORDER — IBUPROFEN 600 MG PO TABS
600.0000 mg | ORAL_TABLET | Freq: Four times a day (QID) | ORAL | Status: DC
  Administered 2015-02-21 – 2015-02-23 (×7): 600 mg via ORAL
  Filled 2015-02-21 (×7): qty 1

## 2015-02-21 MED ORDER — ACETAMINOPHEN 325 MG PO TABS: 650.0000 mg | ORAL_TABLET | ORAL | Status: DC | PRN

## 2015-02-21 MED ORDER — SODIUM CHLORIDE 0.9 % IJ SOLN: 3.0000 mL | INTRAMUSCULAR | Status: DC | PRN

## 2015-02-21 MED ORDER — SIMETHICONE 80 MG PO CHEW: 80.0000 mg | CHEWABLE_TABLET | ORAL | Status: DC | PRN

## 2015-02-21 MED ORDER — LACTATED RINGERS IV SOLN
500.0000 mL | INTRAVENOUS | Status: DC | PRN
  Administered 2015-02-21: 500 mL via INTRAVENOUS

## 2015-02-21 MED ORDER — METHYLERGONOVINE MALEATE 0.2 MG/ML IJ SOLN
INTRAMUSCULAR | Status: AC
  Filled 2015-02-21: qty 1

## 2015-02-21 MED ORDER — LANOLIN HYDROUS EX OINT: TOPICAL_OINTMENT | CUTANEOUS | Status: DC | PRN

## 2015-02-21 MED ORDER — OXYCODONE-ACETAMINOPHEN 5-325 MG PO TABS
1.0000 | ORAL_TABLET | ORAL | Status: DC | PRN
  Filled 2015-02-21 (×2): qty 1

## 2015-02-21 MED ORDER — SODIUM CHLORIDE 0.9 % IV SOLN: Freq: Once | INTRAVENOUS | Status: AC | PRN

## 2015-02-21 MED ORDER — FENTANYL 2.5 MCG/ML BUPIVACAINE 1/10 % EPIDURAL INFUSION (WH - ANES): 14.0000 mL/h | INTRAMUSCULAR | Status: DC | PRN

## 2015-02-21 MED ORDER — TETANUS-DIPHTH-ACELL PERTUSSIS 5-2.5-18.5 LF-MCG/0.5 IM SUSP
0.5000 mL | Freq: Once | INTRAMUSCULAR | Status: AC
  Administered 2015-02-22: 0.5 mL via INTRAMUSCULAR

## 2015-02-21 MED ORDER — CITRIC ACID-SODIUM CITRATE 334-500 MG/5ML PO SOLN: 30.0000 mL | ORAL | Status: DC | PRN

## 2015-02-21 MED ORDER — DIBUCAINE 1 % RE OINT: 1.0000 "application " | TOPICAL_OINTMENT | RECTAL | Status: DC | PRN

## 2015-02-21 MED ORDER — SODIUM CHLORIDE 0.9 % IV SOLN: 250.0000 mL | INTRAVENOUS | Status: DC | PRN

## 2015-02-21 MED ORDER — OXYCODONE-ACETAMINOPHEN 5-325 MG PO TABS
2.0000 | ORAL_TABLET | ORAL | Status: DC | PRN
  Administered 2015-02-23 (×2): 2 via ORAL
  Filled 2015-02-21 (×2): qty 2

## 2015-02-21 MED ORDER — OXYCODONE-ACETAMINOPHEN 5-325 MG PO TABS
1.0000 | ORAL_TABLET | ORAL | Status: DC | PRN
  Administered 2015-02-22 – 2015-02-23 (×6): 1 via ORAL
  Filled 2015-02-21 (×4): qty 1

## 2015-02-21 MED ORDER — LIDOCAINE HCL (PF) 1 % IJ SOLN
INTRAMUSCULAR | Status: DC | PRN
  Administered 2015-02-21 (×2): 5 mL

## 2015-02-21 MED ORDER — METHYLERGONOVINE MALEATE 0.2 MG/ML IJ SOLN
0.2000 mg | Freq: Once | INTRAMUSCULAR | Status: AC
  Administered 2015-02-21: 0.2 mg via INTRAMUSCULAR

## 2015-02-21 MED ORDER — OXYCODONE-ACETAMINOPHEN 5-325 MG PO TABS: 2.0000 | ORAL_TABLET | ORAL | Status: DC | PRN

## 2015-02-21 MED ORDER — PRENATAL MULTIVITAMIN CH
1.0000 | ORAL_TABLET | Freq: Every day | ORAL | Status: DC
  Filled 2015-02-21: qty 1

## 2015-02-21 MED ORDER — DIPHENHYDRAMINE HCL 25 MG PO CAPS: 25.0000 mg | ORAL_CAPSULE | Freq: Four times a day (QID) | ORAL | Status: DC | PRN

## 2015-02-21 MED ORDER — SODIUM CHLORIDE 0.9 % IJ SOLN: 3.0000 mL | Freq: Two times a day (BID) | INTRAMUSCULAR | Status: DC

## 2015-02-21 MED ORDER — PHENYLEPHRINE 40 MCG/ML (10ML) SYRINGE FOR IV PUSH (FOR BLOOD PRESSURE SUPPORT)
80.0000 ug | PREFILLED_SYRINGE | INTRAVENOUS | Status: DC | PRN
  Filled 2015-02-21: qty 2
  Filled 2015-02-21: qty 20

## 2015-02-21 MED ORDER — ONDANSETRON HCL 4 MG PO TABS: 4.0000 mg | ORAL_TABLET | ORAL | Status: DC | PRN

## 2015-02-21 MED ORDER — DIPHENHYDRAMINE HCL 50 MG/ML IJ SOLN: 12.5000 mg | INTRAMUSCULAR | Status: DC | PRN

## 2015-02-21 MED ORDER — LACTATED RINGERS IV SOLN
INTRAVENOUS | Status: DC
  Administered 2015-02-21 (×2): via INTRAVENOUS

## 2015-02-21 MED ORDER — BENZOCAINE-MENTHOL 20-0.5 % EX AERO: 1.0000 "application " | INHALATION_SPRAY | CUTANEOUS | Status: DC | PRN

## 2015-02-21 MED ORDER — FENTANYL 2.5 MCG/ML BUPIVACAINE 1/10 % EPIDURAL INFUSION (WH - ANES)
14.0000 mL/h | INTRAMUSCULAR | Status: DC | PRN
  Administered 2015-02-21: 14 mL/h via EPIDURAL
  Filled 2015-02-21: qty 125

## 2015-02-21 MED ORDER — OXYTOCIN 40 UNITS IN LACTATED RINGERS INFUSION - SIMPLE MED
62.5000 mL/h | INTRAVENOUS | Status: DC
  Administered 2015-02-21: 62.5 mL/h via INTRAVENOUS

## 2015-02-21 MED ORDER — TERBUTALINE SULFATE 1 MG/ML IJ SOLN
0.2500 mg | Freq: Once | INTRAMUSCULAR | Status: AC | PRN
  Filled 2015-02-21: qty 1

## 2015-02-21 MED ORDER — ONDANSETRON HCL 4 MG/2ML IJ SOLN: 4.0000 mg | Freq: Four times a day (QID) | INTRAMUSCULAR | Status: DC | PRN

## 2015-02-21 MED ORDER — ZOLPIDEM TARTRATE 5 MG PO TABS: 5.0000 mg | ORAL_TABLET | Freq: Every evening | ORAL | Status: DC | PRN

## 2015-02-21 MED ORDER — FENTANYL CITRATE (PF) 100 MCG/2ML IJ SOLN
100.0000 ug | INTRAMUSCULAR | Status: DC | PRN
  Administered 2015-02-21: 100 ug via INTRAVENOUS
  Filled 2015-02-21: qty 2

## 2015-02-21 MED ORDER — ONDANSETRON HCL 4 MG/2ML IJ SOLN: 4.0000 mg | INTRAMUSCULAR | Status: DC | PRN

## 2015-02-21 MED ORDER — OXYTOCIN 40 UNITS IN LACTATED RINGERS INFUSION - SIMPLE MED
1.0000 m[IU]/min | INTRAVENOUS | Status: DC
  Administered 2015-02-21: 2 m[IU]/min via INTRAVENOUS
  Filled 2015-02-21: qty 1000

## 2015-02-21 NOTE — MAU Note (Signed)
Per HMitchell, RN charge, pt to go to room 160.

## 2015-02-21 NOTE — Anesthesia Procedure Notes (Signed)
Epidural Patient location during procedure: OB Start time: 02/21/2015 2:50 PM End time: 02/21/2015 3:10 PM  Staffing Anesthesiologist: Sebastian AcheMANNY, Jasani Lengel  Preanesthetic Checklist Completed: patient identified, site marked, surgical consent, pre-op evaluation, timeout performed, IV checked, risks and benefits discussed and monitors and equipment checked  Epidural Patient position: sitting Prep: site prepped and draped and DuraPrep Patient monitoring: heart rate, continuous pulse ox and blood pressure Approach: midline Location: L3-L4 Injection technique: LOR air  Needle:  Needle type: Tuohy  Needle gauge: 18 G Needle length: 9 cm and 9 Needle insertion depth: 6 cm Catheter type: closed end flexible Catheter size: 19 Gauge Catheter at skin depth: 15 cm Test dose: negative  Assessment Events: blood not aspirated, injection not painful, no injection resistance, negative IV test and no paresthesia  Additional Notes   Patient tolerated the insertion well without complications.Reason for block:procedure for pain

## 2015-02-21 NOTE — H&P (Signed)
Marie Murphy is a 29 y.o. female presenting for active labor. Maternal Medical History:  Reason for admission: Contractions.   Contractions: Onset was 13-24 hours ago.   Frequency: regular.   Perceived severity is moderate.    Fetal activity: Perceived fetal activity is normal.   Last perceived fetal movement was within the past hour.    Prenatal complications: no prenatal complications   OB History    Gravida Para Term Preterm AB TAB SAB Ectopic Multiple Living   Past Medical History  Diagnosis Date  . Headache(784.0)   . Arrhythmia   . Ovarian cyst   . Hypothyroidism     no meds since 2012   Past Surgical History  Procedure Laterality Date  . Tonsillectomy    . Dilation and curettage of uterus     Family History: family history includes Bipolar disorder in her mother; Cancer in her maternal aunt and maternal grandmother; Coronary artery disease in her maternal uncle; Diabetes in her maternal aunt, maternal grandmother, maternal uncle, and mother; Hypertension in her maternal grandmother and mother; Stroke in her maternal grandmother and maternal uncle; Thyroid disease in her maternal grandmother, maternal uncle, mother, and paternal aunt. Social History:  reports that she quit smoking about 2 years ago. Her smoking use included Cigarettes. She smoked 1.00 pack per day. She has never used smokeless tobacco. She reports that she does not drink alcohol or use illicit drugs.   Prenatal Transfer Tool  Maternal Diabetes: No Genetic Screening: Normal Maternal Ultrasounds/Referrals: Normal Fetal Ultrasounds or other Referrals:  None Maternal Substance Abuse:  No Significant Maternal Medications:  None Significant Maternal Lab Results:  Lab values include: Other:  Other Comments:  pt has warm antibody but seen by MFM  Review of Systems  Constitutional: Negative.   HENT: Negative.   Eyes: Negative.   Respiratory: Negative.   Cardiovascular: Negative.    Gastrointestinal: Positive for abdominal pain.  Genitourinary: Negative.   Musculoskeletal: Negative.   Skin: Negative.   Neurological: Negative.   Endo/Heme/Allergies: Negative.   Psychiatric/Behavioral: Negative.     Dilation: 5.5 Effacement (%): 90 Station: -1 Exam by:: Hart Rochester, CNM Blood pressure 125/81, pulse 85, temperature 97.5 F (36.4 C), temperature source Oral, resp. rate 16, height  (1.778 m), weight 252 lb (114.306 kg), not currently breastfeeding. Maternal Exam:  Uterine Assessment: Contraction strength is moderate.  Contraction frequency is regular.   Abdomen: Patient reports no abdominal tenderness. Fetal presentation: vertex  Introitus: Normal vagina.  Amniotic fluid character: not assessed.  Pelvis: adequate for delivery.   Cervix: Cervix evaluated by digital exam.     Fetal Exam Fetal Monitor Review: Mode: ultrasound.   Variability: moderate (6-25 bpm).   Pattern: accelerations present.    Fetal State Assessment: Category I - tracings are normal.     Physical Exam  Constitutional: She is oriented to person, place, and time. She appears well-developed and well-nourished.  HENT:  Head: Normocephalic.  Eyes: Pupils are equal, round, and reactive to light.  Neck: Normal range of motion.  Cardiovascular: Normal rate, regular rhythm, normal heart sounds and intact distal pulses.   Respiratory: Effort normal and breath sounds normal.  GI: Soft. Bowel sounds are normal.  Genitourinary: Vagina normal and uterus normal.  Musculoskeletal: Normal range of motion.  Neurological: She is alert and oriented to person, place, and time. She has normal reflexes.  Skin: Skin is warm and dry.  Psychiatric:  She has a normal mood and affect. Her behavior is normal. Judgment and thought content normal.    Prenatal labs: ABO, Rh: --/--/O POS (05/31 1240) Antibody: POS (05/31 1240) Rubella:   RPR: Reactive (04/14 0928)  HBsAg: Negative (02/04 1619)  HIV:     GBS: Negative (06/26 0000)   Assessment/Plan: SVE 6/90/-1, active labor will admit   Marie Murphy, Marie Murphy 02/21/2015, 10:25 AM

## 2015-02-21 NOTE — Progress Notes (Signed)
Marie Murphy is a 29 y.o. [id number] at 1737w6d by ultrasound admitted for active labor  Subjective:   Objective: BP 109/56 mmHg  Pulse 79  Temp(Src) 97.8 F (36.6 C) (Oral)  Resp 18  Ht 5\' 10"  (1.778 m)  Wt 252 lb (114.306 kg)  BMI 36.16 kg/m2  Breastfeeding? No      FHT:  FHR: 120 bpm, variability: moderate,  accelerations:  Present,  decelerations:  Absent UC:   regular, every 4 minutes SVE:   Dilation: 5 Effacement (%): 90 Station: -3 Exam by:: Chanda BusingKatherine G Jones RM  Labs: Lab Results  Component Value Date   WBC 9.3 02/21/2015   HGB 12.5 02/21/2015   HCT 35.5* 02/21/2015   MCV 90.8 02/21/2015   PLT 163 02/21/2015    Assessment / Plan: Augmentation of labor, progressing well  Labor: Progressing normally Preeclampsia:  no signs or symptoms of toxicity and intake and ouput balanced Fetal Wellbeing:  Category I Pain Control:  Epidural I/D:  n/a Anticipated MOD:  NSVD  LAWSON, MARIE DARLENE 02/21/2015, 12:58 PM

## 2015-02-21 NOTE — MAU Note (Signed)
Patient presents at [redacted] weeks gestation with c/o 3-4 minutes apart 0600 today. Fetus active. Denies discharge but did see pinkish bloody show last night around 2200 before contractions started this morning.

## 2015-02-21 NOTE — MAU Note (Signed)
Pt states was 1cm in office Thursday. Contractions 5 minutes apart. No gush of fluid. Blood tinged mucus discharge.

## 2015-02-21 NOTE — Anesthesia Preprocedure Evaluation (Addendum)
Anesthesia Evaluation  Patient identified by MRN, date of birth, ID band Patient awake and Patient confused    Reviewed: Allergy & Precautions, H&P , NPO status , Patient's Chart, lab work & pertinent test results  Airway Mallampati: II       Dental   Pulmonary former smoker,  breath sounds clear to auscultation  Pulmonary exam normal       Cardiovascular Exercise Tolerance: Good Normal cardiovascular examRhythm:regular Rate:Normal     Neuro/Psych    GI/Hepatic   Endo/Other  Morbid obesity  Renal/GU      Musculoskeletal   Abdominal   Peds  Hematology   Anesthesia Other Findings   Reproductive/Obstetrics (+) Pregnancy                            Anesthesia Physical Anesthesia Plan  ASA: II  Anesthesia Plan: Epidural   Post-op Pain Management:    Induction:   Airway Management Planned:   Additional Equipment:   Intra-op Plan:   Post-operative Plan:   Informed Consent: I have reviewed the patients History and Physical, chart, labs and discussed the procedure including the risks, benefits and alternatives for the proposed anesthesia with the patient or authorized representative who has indicated his/her understanding and acceptance.     Plan Discussed with:   Anesthesia Plan Comments:        Anesthesia Quick Evaluation

## 2015-02-22 ENCOUNTER — Inpatient Hospital Stay (HOSPITAL_COMMUNITY): Admission: RE | Admit: 2015-02-22 | Payer: Medicaid Other | Source: Ambulatory Visit

## 2015-02-22 LAB — RPR: RPR Ser Ql: REACTIVE — AB

## 2015-02-22 LAB — RPR, QUANT+TP ABS (REFLEX): TREPONEMA PALLIDUM AB: NEGATIVE

## 2015-02-22 NOTE — Lactation Note (Signed)
This note was copied from the chart of Marie Murphy. Lactation Consultation Note Went into room several different times during the night and everyone was sleeping.  Patient Name: Marie Dorena Dewmysue Packett ZOXWR'UToday's Date: 02/22/2015     Maternal Data    Feeding Feeding Type: Breast Fed Length of feed: 20 min  LATCH Score/Interventions Latch: Grasps breast easily, tongue down, lips flanged, rhythmical sucking.  Audible Swallowing: A few with stimulation  Type of Nipple: Everted at rest and after stimulation  Comfort (Breast/Nipple): Soft / non-tender     Hold (Positioning): No assistance needed to correctly position infant at breast.  LATCH Score: 9  Lactation Tools Discussed/Used     Consult Status      Fantasy Donald G 02/22/2015, 6:18 AM

## 2015-02-22 NOTE — Progress Notes (Signed)
Post Partum Day 1 Subjective: no complaints, up ad lib, voiding and tolerating PO  Objective: Blood pressure 130/77, pulse 91, temperature 97.7 F (36.5 C), temperature source Oral, resp. rate 18, height 5\' 10"  (1.778 m), weight 252 lb (114.306 kg), SpO2 97 %, unknown if currently breastfeeding.  Physical Exam:  General: alert, cooperative, appears stated age and no distress Lochia: appropriate Uterine Fundus: firm Incision: n/a DVT Evaluation: No evidence of DVT seen on physical exam. Negative Homan's sign. No cords or calf tenderness. No significant calf/ankle edema.   Recent Labs  02/21/15 0947  HGB 12.5  HCT 35.5*    Assessment/Plan: Plan for discharge tomorrow   LOS: 1 day   Babara Buffalo DARLENE 02/22/2015, 4:21 AM

## 2015-02-22 NOTE — Progress Notes (Signed)
UR chart review completed.  

## 2015-02-22 NOTE — Progress Notes (Signed)
CNM Shaw notified of patient's reactive RPR. No new orders at this time.

## 2015-02-22 NOTE — Anesthesia Postprocedure Evaluation (Signed)
  Anesthesia Post-op Note  Patient: Marie Murphy  Procedure(s) Performed: * No procedures listed *  Patient Location: PACU and Mother/Baby  Anesthesia Type:Epidural  Level of Consciousness: awake, alert  and oriented  Airway and Oxygen Therapy: Patient Spontanous Breathing  Post-op Pain: none  Post-op Assessment: Post-op Vital signs reviewed and Patient's Cardiovascular Status Stable              Post-op Vital Signs: Reviewed and stable  Last Vitals:  Filed Vitals:   02/22/15 0700  BP: 108/54  Pulse: 72  Temp: 36.7 C  Resp: 18    Complications: No apparent anesthesia complications

## 2015-02-23 MED ORDER — IBUPROFEN 600 MG PO TABS: 600.0000 mg | ORAL_TABLET | Freq: Four times a day (QID) | ORAL | Status: DC

## 2015-02-23 NOTE — Discharge Summary (Signed)
Obstetric Discharge Summary Reason for Admission: onset of labor Prenatal Procedures: none Intrapartum Procedures: spontaneous vaginal delivery Postpartum Procedures: none Complications-Operative and Postpartum: Reactive RPR (1:2)/T Pallidum Abs: neg (unchanged from prev lab)  At 3:48 PM a viable female was delivered via (Presentation:vertes LOA ; ). APGAR:9 , 9; weight .  Placenta status:spont , 3 vc. Cord: with the following complications:none . Cord pH: n/a  Anesthesia: epidural  Episiotomy: none  Lacerations: none Suture Repair: n/a Est. Blood Loss 150(mL):   Mom to postpartum. Baby to Couplet care / Skin to Skin.  Hospital Course:  Active Problems:   Pregnancy   Marie Murphy is a 29 y.o. [id number] s/p SVD.  Patient was admitted 02/21/15.  She has postpartum course that was uncomplicated including no problems with ambulating, PO intake, urination, pain, or bleeding. The pt feels ready to go home and  will be discharged with outpatient follow-up.   Today: No acute events overnight.  Pt denies problems with ambulating, voiding or po intake.  She denies nausea or vomiting.  Pain is well controlled.  She has had flatus. She has not had bowel movement.  Lochia Moderate.  Plan for birth control is vasectomy.  Method of Feeding: Breast  Physical Exam:  General: alert and cooperative Lochia: appropriate Uterine Fundus: firm DVT Evaluation: No evidence of DVT seen on physical exam.  H/H: Lab Results  Component Value Date/Time   HGB 12.5 02/21/2015 09:47 AM   HCT 35.5* 02/21/2015 09:47 AM   HCT 37.0 02/01/2015 04:51 PM    Discharge Diagnoses: Term Pregnancy-delivered  Discharge Information: Date: 02/23/2015 Activity: pelvic rest Diet: routine  Medications: None Breast feeding:  Yes Condition: stable Instructions: refer to handout Discharge to: home    Medication List    STOP taking these medications        acetaminophen 500 MG chewable tablet   Commonly known as:  TYLENOL     azithromycin 250 MG tablet  Commonly known as:  ZITHROMAX     ondansetron 8 MG disintegrating tablet  Commonly known as:  ZOFRAN ODT      TAKE these medications        ibuprofen 600 MG tablet  Commonly known as:  ADVIL,MOTRIN  Take 1 tablet (600 mg total) by mouth every 6 (six) hours.     Prenatal Vitamins 0.8 MG tablet  Take 1 tablet by mouth daily.           Chana BodeNicholas E Linde ,PA-2 02/23/2015,7:32 AM   CNM attestation I have seen and examined this patient.   Marie Murphy is a 29 y.o. [id number] s/p SVD.   Pain is well controlled.  Plan for birth control is vasectomy.  Method of Feeding: breast  PE:  BP 118/78 mmHg  Pulse 67  Temp(Src) 98.1 F (36.7 C) (Oral)  Resp 17  Ht 5\' 10"  (1.778 m)  Wt 114.306 kg (252 lb)  BMI 36.16 kg/m2  SpO2 95%  Breastfeeding? Unknown Fundus firm   Recent Labs  02/21/15 0947  HGB 12.5  HCT 35.5*     Plan: discharge today - postpartum care discussed - f/u clinic in 6 weeks for postpartum visit   Harlen Danford, CNM 9:10 AM

## 2015-02-23 NOTE — Discharge Instructions (Signed)

## 2015-02-23 NOTE — Lactation Note (Signed)
This note was copied from the chart of Marie Murphy. Lactation Consultation Note  Patient Name: Marie Murphy Today's Date: 02/23/2015 Reason for consult: Initial assessment Mom is experienced BF for 2 years, denies any questions or concerns. Lactation brochure left for review, advised of OP services and support group. Encouraged to call if she would like assist.   Maternal Data Has patient been taught Hand Expression?: No (Mom is experienced BF and reports she knows how to hand express) Does the patient have breastfeeding experience prior to this delivery?: Yes  Feeding    LATCH Score/Interventions                      Lactation Tools Discussed/Used     Consult Status Consult Status: Complete    Alfred LevinsGranger, Idonna Heeren Ann 02/23/2015, 12:05 PM

## 2015-02-24 ENCOUNTER — Telehealth: Payer: Self-pay | Admitting: Obstetrics and Gynecology

## 2015-02-24 ENCOUNTER — Other Ambulatory Visit: Payer: Self-pay | Admitting: Advanced Practice Midwife

## 2015-02-24 MED ORDER — HYDROCODONE-ACETAMINOPHEN 5-325 MG PO TABS: 1.0000 | ORAL_TABLET | Freq: Four times a day (QID) | ORAL | Status: DC | PRN

## 2015-02-24 MED ORDER — TRAMADOL HCL 50 MG PO TABS: 50.0000 mg | ORAL_TABLET | Freq: Four times a day (QID) | ORAL | Status: DC | PRN

## 2015-02-24 NOTE — Telephone Encounter (Signed)
I spoke with Dr. Emelda FearFerguson and he gave a verbal order for hydrocodone 5/325mg  # 20 with no refills. I will contact the pt and let her know to come by and pick the Rx up!

## 2015-02-24 NOTE — Progress Notes (Signed)
rx tramadol  # 30 for postpartum pain. Continue ibuprofen for cramps

## 2015-02-25 ENCOUNTER — Telehealth: Payer: Self-pay | Admitting: *Deleted

## 2015-02-25 LAB — TYPE AND SCREEN
ABO/RH(D): O POS
ANTIBODY SCREEN: POSITIVE
DAT, IgG: NEGATIVE
Unit division: 0
Unit division: 0

## 2015-02-25 NOTE — Telephone Encounter (Signed)
Pt aware that Rx is in front office for her to pick up.

## 2015-03-30 ENCOUNTER — Ambulatory Visit (INDEPENDENT_AMBULATORY_CARE_PROVIDER_SITE_OTHER): Payer: Medicaid Other | Admitting: Women's Health

## 2015-03-30 ENCOUNTER — Encounter: Payer: Self-pay | Admitting: Women's Health

## 2015-03-30 NOTE — Progress Notes (Signed)
Patient ID: Marie Murphy, female   DOB: [birth year], 29 y.o.   MRN: [id number] Subjective:    Marie Murphy is a 29 y.o. [id number] Caucasian female who presents for a postpartum visit. She is 4 weeks postpartum following a spontaneous vaginal delivery at 40.6 gestational weeks. Anesthesia: epidural. I have fully reviewed the prenatal and intrapartum course. Postpartum course has been uncomplicated. Baby's course has been complicated by reflux- no meds and small umbilical hernia. Baby is feeding by breast. Bleeding thin lochia. Bowel function is normal. Bladder function is normal. Patient is not sexually active. Last sexual activity: prior to birth of baby. Contraception method is abstinence and plans for husband to get vasectomy- doesn't want contraception in the interim- if has sex will use condom. Postpartum depression screening: negative. Score 1.  Last pap 09/10/14 and was neg w/ -HRHPV.  The following portions of the patient's history were reviewed and updated as appropriate: allergies, current medications, past medical history, past surgical history and problem list.  Review of Systems Pertinent items are noted in HPI.   Filed Vitals:   03/30/15 1026  BP: 112/76  Height:  (1.803 m)  Weight: 223 lb 8 oz (101.379 kg)   No LMP recorded.  Objective:   General:  alert, cooperative and no distress   Breasts:  deferred, no complaints  Lungs: clear to auscultation bilaterally  Heart:  regular rate and rhythm  Abdomen: soft, nontender   Vulva: normal  Vagina: normal vagina  Cervix:  closed  Corpus: Well-involuted  Adnexa:  Non-palpable  Rectal Exam: No hemorrhoids        Assessment:   Postpartum exam 4 wks s/p SVB Breastfeeding Depression screening Contraception counseling   Plan:   Contraception: abstinence/condoms, husband vasectomy Follow up in: 1 year for physical or earlier if needed  [name] CNM, [facility] 03/30/2015 10:48 AM

## 2015-03-31 ENCOUNTER — Ambulatory Visit: Payer: Medicaid Other | Admitting: Women's Health

## 2015-07-26 ENCOUNTER — Telehealth: Payer: Self-pay | Admitting: Women's Health

## 2015-07-26 MED ORDER — PRENATAL PLUS 27-1 MG PO TABS: 1.0000 | ORAL_TABLET | Freq: Every day | ORAL | Status: AC

## 2015-07-26 NOTE — Telephone Encounter (Signed)
Pt states needs a new Rx for PNV, [facility] will not cover previous PNV prescription.  Pt c/o cough and cold x 2 weeks requesting z-pack. Pt advised to f/u with PCP. Pt states she doesn't have a PCP, informed pt would need an appt. Pt states she is not able to come in has 5 kids at home, has received z-pack in past with out an appt.   Informed pt would route message and see what provider recommended.

## 2015-07-26 NOTE — Telephone Encounter (Signed)
Pt informed Rx for PNV sent to pharmacy, also, pt will need an appt to be evaluated for a z-pack. Pt states she can not make an appt at this time due to having 5 kids. Pt informed can go to Urgent care per Joellyn HaffKim Booker, CNM is unable to be seen here. Pt verbalized understanding.

## 2016-05-18 ENCOUNTER — Ambulatory Visit: Payer: Self-pay | Admitting: Obstetrics and Gynecology

## 2016-06-12 ENCOUNTER — Ambulatory Visit: Payer: Medicaid Other | Admitting: Obstetrics & Gynecology

## 2023-02-27 ENCOUNTER — Ambulatory Visit (INDEPENDENT_AMBULATORY_CARE_PROVIDER_SITE_OTHER): Payer: BC Managed Care – PPO | Admitting: Nurse Practitioner

## 2023-02-27 ENCOUNTER — Encounter: Payer: Self-pay | Admitting: Nurse Practitioner

## 2023-02-27 VITALS — BP 105/73 | HR 83 | Temp 97.6°F | Ht 71.0 in | Wt 215.6 lb

## 2023-02-27 DIAGNOSIS — E039 Hypothyroidism, unspecified: Secondary | ICD-10-CM

## 2023-02-27 DIAGNOSIS — Z808 Family history of malignant neoplasm of other organs or systems: Secondary | ICD-10-CM | POA: Insufficient documentation

## 2023-02-27 DIAGNOSIS — Z87898 Personal history of other specified conditions: Secondary | ICD-10-CM | POA: Diagnosis not present

## 2023-02-27 DIAGNOSIS — Z1231 Encounter for screening mammogram for malignant neoplasm of breast: Secondary | ICD-10-CM | POA: Insufficient documentation

## 2023-02-27 DIAGNOSIS — G43019 Migraine without aura, intractable, without status migrainosus: Secondary | ICD-10-CM

## 2023-02-27 DIAGNOSIS — Z0000 Encounter for general adult medical examination without abnormal findings: Secondary | ICD-10-CM | POA: Insufficient documentation

## 2023-02-27 DIAGNOSIS — Z803 Family history of malignant neoplasm of breast: Secondary | ICD-10-CM | POA: Insufficient documentation

## 2023-02-27 DIAGNOSIS — Z207 Contact with and (suspected) exposure to pediculosis, acariasis and other infestations: Secondary | ICD-10-CM | POA: Insufficient documentation

## 2023-02-27 HISTORY — DX: Personal history of other specified conditions: Z87.898

## 2023-02-27 LAB — PREGNANCY, URINE: Preg Test, Ur: NEGATIVE

## 2023-02-27 MED ORDER — CETIRIZINE HCL 10 MG PO TABS
10.0000 mg | ORAL_TABLET | Freq: Every day | ORAL | 1 refills | Status: AC
Start: 2023-02-27 — End: ?

## 2023-02-27 MED ORDER — PERMETHRIN 5 % EX CREA
1.0000 | TOPICAL_CREAM | Freq: Once | CUTANEOUS | 0 refills | Status: AC
Start: 2023-02-27 — End: 2023-02-27

## 2023-02-27 MED ORDER — SUMATRIPTAN SUCCINATE 25 MG PO TABS
25.0000 mg | ORAL_TABLET | Freq: Once | ORAL | 0 refills | Status: AC | PRN
Start: 2023-02-27 — End: ?

## 2023-02-27 NOTE — Progress Notes (Signed)
Established Patient Office Visit  Subjective   Patient ID: Marie Murphy, female    DOB: [birth year]  Age: 37 y.o. MRN: [id number]  Chief Complaint  Patient presents with   Establish Care   Sarcoptes Marsa Aris    Pt's son has scabies pt now has similar rash for past week    HPI Marie Murphy 37 year old female seen today to establish care incarcerated for scabies.  Past medical history migraine, hypothyroidismAmySue M Murphy, . Scabies  She is complaining  of severe itching and a rash that started approximately 2-weeks ago. Her younger son was dx and treated with  2 -months ago. The itching is described as intense and is worse at night, disturbing sleep. The rash initially appeared as small, raised bumps but has since spread across the body, including the hands, wrists, elbows, and abdomen. The patient denies any fever, chills, or recent travel. There is no history of recent changes in laundry detergent, soap, or exposure to new chemicals. The patient attempted over-the-counter anti-itch creams without relief.  Migraines  history of chronic migraines. She reports experiencing migraines since the age of 66, with episodes occurring approximately daily. Each migraine episode is unilateral throbbing headache, sensitivity to light and sound. Denies nausea, vomiting,or the presence of. Her migraines have been less responsive to over-the-counter medications such as Excedrin Migraine, providing only minimal relief. The migraines typically last for 30 mins to 1 hr. and are associated with significant impairment in daily activities, including work and social functions. No specific triggering event noted for the recent exacerbation of migraines. The patient denies any recent head trauma, fever, or changes in vision.  Family history of breast cancer and ovarian cancer on maternal and paternal Maternal and paternal aunt    Hystory of breat lump 10 yrs ago had mommagram and it was normal,  and that was the last mamamogram.  We discussed putting a referral for mammogram she agrees.  Smokes 1/2 pack started since she was 12 not ready to quit. Deneis the use EtOH or illicit drugs including THC  LMP 01/29/2023, HC negative  Patient Active Problem List   Diagnosis Date Noted   Routine medical exam 02/27/2023   Family history of breast cancer 02/27/2023   Family history of secondary ovarian cancer 02/27/2023   History of lump of left breast 02/27/2023   Screening mammogram for breast cancer 02/27/2023   Scabies exposure 02/27/2023   Warm auto-antibody complicating pregnancy 10/26/2014   History of postpartum gestational hypertension 09/23/2014   Biological false positive RPR test 09/23/2014   Hypothyroidism    Migraines 10/09/2012   Arrhythmia 10/09/2012   Past Medical History:  Diagnosis Date   Arrhythmia    Headache(784.0)    History of lump of left breast 02/27/2023   Hypothyroidism    no meds since 2012   Ovarian cyst    Past Surgical History:  Procedure Laterality Date   DILATION AND CURETTAGE OF UTERUS     TONSILLECTOMY     Social History   Tobacco Use   Smoking status: Some Days    Current packs/day: 0.00    Types: Cigarettes    Last attempt to quit: 05/24/2012    Years since quitting: 10.7   Smokeless tobacco: Never   Tobacco comments:    down to 1 cigarette a day  Vaping Use   Vaping status: Never Used  Substance Use Topics   Alcohol use: No    Comment: None since + preg Test   Drug  use: No    Types: Marijuana    Comment: None since + preg test last used in 2012   Social History   Socioeconomic History   Marital status: Married    Spouse name: Not on file   Number of children: Not on file   Years of education: Not on file   Highest education level: Not on file  Occupational History   Not on file  Tobacco Use   Smoking status: Some Days    Current packs/day: 0.00    Types: Cigarettes    Last attempt to quit: 05/24/2012    Years  since quitting: 10.7   Smokeless tobacco: Never   Tobacco comments:    down to 1 cigarette a day  Vaping Use   Vaping status: Never Used  Substance and Sexual Activity   Alcohol use: No    Comment: None since + preg Test   Drug use: No    Types: Marijuana    Comment: None since + preg test last used in 2012   Sexual activity: Yes    Birth control/protection: Condom  Other Topics Concern   Not on file  Social History Narrative   Not on file   Social Determinants of Health   Financial Resource Strain: Low Risk  (02/27/2023)   Overall Financial Resource Strain (CARDIA)    Difficulty of Paying Living Expenses: Not hard at all  Food Insecurity: No Food Insecurity (02/27/2023)   Hunger Vital Sign    Worried About Running Out of Food in the Last Year: Never true    Ran Out of Food in the Last Year: Never true  Transportation Needs: No Transportation Needs (02/27/2023)   PRAPARE - Administrator, Civil Service (Medical): No    Lack of Transportation (Non-Medical): No  Physical Activity: Not on file  Stress: Not on file  Social Connections: Not on file  Intimate Partner Violence: Not At Risk (02/27/2023)   Humiliation, Afraid, Rape, and Kick questionnaire    Fear of Current or Ex-Partner: No    Emotionally Abused: No    Physically Abused: No    Sexually Abused: No   Family Status  Relation Name Status   Other Family Hx. (Not Specified)   Son  Alive   Son  Alive   Mother  (Not Specified)   Mat Aunt  (Not Specified)   Mat Uncle  (Not Specified)   [employer]  (Not Specified)   [employer]  (Not Specified)  No partnership data on file   Family History  Problem Relation Age of Onset   Diabetes Mother    Hypertension Mother    Bipolar disorder Mother    Thyroid disease Mother    Cancer Maternal Aunt        ovarian   Diabetes Maternal Aunt    Diabetes Maternal Uncle    Stroke Maternal Uncle    Coronary artery disease Maternal Uncle    Thyroid disease Maternal Uncle     Thyroid disease Paternal Aunt    Cancer Maternal Grandmother    Diabetes Maternal Grandmother    Hypertension Maternal Grandmother    Stroke Maternal Grandmother    Thyroid disease Maternal Grandmother    No Known Allergies    Review of Systems  Constitutional:  Negative for chills and fever.  Eyes:  Negative for blurred vision and double vision.  Respiratory:  Negative for shortness of breath.   Cardiovascular:  Negative for chest pain and leg swelling.  Gastrointestinal:  Negative for nausea and vomiting.  Musculoskeletal:  Negative for falls, joint pain and myalgias.  Skin:  Positive for itching and rash.  Neurological:  Positive for headaches. Negative for dizziness and tingling.  Endo/Heme/Allergies:  Negative for polydipsia. Does not bruise/bleed easily.   Negative unless indicated in HPI   Objective:     BP 105/73   Pulse 83   Temp 97.6 F (36.4 C) (Temporal)   Ht 5\' 11"  (1.803 m)   Wt 215 lb 9.6 oz (97.8 kg)   SpO2 97%   BMI 30.07 kg/m  BP Readings from Last 3 Encounters:  02/27/23 105/73  03/30/15 112/76  02/23/15 118/78   Wt Readings from Last 3 Encounters:  02/27/23 215 lb 9.6 oz (97.8 kg)  03/30/15 223 lb 8 oz (101.4 kg)  02/21/15 252 lb (114.3 kg)      Physical Exam Vitals and nursing note reviewed.  Constitutional:      Appearance: Normal appearance. She is overweight.  HENT:     Head: Normocephalic and atraumatic.  Eyes:     General: No scleral icterus.    Extraocular Movements: Extraocular movements intact.     Conjunctiva/sclera: Conjunctivae normal.     Pupils: Pupils are equal, round, and reactive to light.  Cardiovascular:     Rate and Rhythm: Normal rate and regular rhythm.     Pulses: Normal pulses.     Heart sounds: Normal heart sounds.  Pulmonary:     Effort: Pulmonary effort is normal.     Breath sounds: Normal breath sounds.  Abdominal:     General: Bowel sounds are normal. There is no distension.     Palpations: Abdomen is  soft. There is no mass.  Musculoskeletal:     Cervical back: Normal range of motion and neck supple. No rigidity.  Skin:    General: Skin is warm and dry.     Findings: Rash present.  Neurological:     General: No focal deficit present.     Mental Status: She is alert and oriented to person, place, and time. Mental status is at baseline.  Psychiatric:        Mood and Affect: Mood normal.        Behavior: Behavior normal.        Thought Content: Thought content normal.        Judgment: Judgment normal.     Results for orders placed or performed in visit on 02/27/23  Pregnancy, urine  Result Value Ref Range   Preg Test, Ur Negative Negative    Last CBC Lab Results  Component Value Date   WBC 9.3 02/21/2015   HGB 12.5 02/21/2015   HCT 35.5 (L) 02/21/2015   MCV 90.8 02/21/2015   MCH 32.0 02/21/2015   RDW 13.3 02/21/2015   PLT 163 02/21/2015   Last metabolic panel Lab Results  Component Value Date   GLUCOSE 91 02/23/2013   NA 135 02/23/2013   K 4.1 02/23/2013   CL 99 02/23/2013   CO2 22 02/23/2013   BUN 12 02/23/2013   CREATININE 0.73 02/23/2013   CALCIUM 9.8 02/23/2013   PROT 6.8 02/23/2013   ALBUMIN 3.1 (L) 02/23/2013   BILITOT 0.2 (L) 02/23/2013   ALKPHOS 123 (H) 02/23/2013   AST 14 02/23/2013   ALT 10 02/23/2013   Last lipids No results found for: "CHOL", "HDL", "LDLCALC", "LDLDIRECT", "TRIG", "CHOLHDL"      Assessment & Plan:  Routine medical exam -     CMP14+EGFR -  CBC with Differential/Platelet -     Lipid panel -     Thyroid Panel With TSH -     Pregnancy, urine  Intractable migraine without aura and without status migrainosus -     SUMAtriptan Succinate; Take 1 tablet (25 mg total) by mouth once as needed for up to 1 dose for migraine. May repeat in 2 hours if headache persists or recurs.  Dispense: 10 tablet; Refill: 0  Family history of breast cancer -     MM 3D DIAGNOSTIC MAMMOGRAM BILATERAL BREAST  Family history of secondary ovarian  cancer  History of lump of left breast -     MM 3D DIAGNOSTIC MAMMOGRAM BILATERAL BREAST  Screening mammogram for breast cancer -     MM 3D DIAGNOSTIC MAMMOGRAM BILATERAL BREAST  Scabies exposure -     Permethrin; Apply 1 Application topically once for 1 dose.  Dispense: 60 g; Refill: 0 -     Cetirizine HCl; Take 1 tablet (10 mg total) by mouth daily.  Dispense: 30 tablet; Refill: 1  Acquired hypothyroidism -     Thyroid Panel With TSH  Tamaria M Williams 37 year old female in no acute distress Scabies  topical permethrin cream for application overnight, as well as oral antihistamines for symptomatic relief of itching. Education: Provide patient education on scabies, including hygiene measures and household contacts who may need treatment  Migraine: Imitrex 25 mg 1 tab as needed may repeat in 2 hours if no relief Possible referral to neurology if migraines persist  Labs: CBC, CMP, lipid, TSH Referral sent for mammogram Plan of care discussed with client all question answered Return for physical with pap and evaluation of migraine med.    Arrie Aran Santa Lighter, [facility] names ambulates with PT No history of problems with bloating family Hx AAA posterior lateral branch history of provide okay but [address] [place], [place] [address] [phone]

## 2023-02-28 LAB — CMP14+EGFR
ALT: 11 IU/L (ref 0–32)
AST: 13 IU/L (ref 0–40)
Albumin: 4.1 g/dL (ref 3.9–4.9)
Alkaline Phosphatase: 59 IU/L (ref 44–121)
BUN/Creatinine Ratio: 11 (ref 9–23)
BUN: 9 mg/dL (ref 6–20)
Bilirubin Total: 0.2 mg/dL (ref 0.0–1.2)
CO2: 24 mmol/L (ref 20–29)
Calcium: 9.1 mg/dL (ref 8.7–10.2)
Chloride: 106 mmol/L (ref 96–106)
Creatinine, Ser: 0.82 mg/dL (ref 0.57–1.00)
Globulin, Total: 2.5 g/dL (ref 1.5–4.5)
Glucose: 90 mg/dL (ref 70–99)
Potassium: 4.5 mmol/L (ref 3.5–5.2)
Sodium: 139 mmol/L (ref 134–144)
Total Protein: 6.6 g/dL (ref 6.0–8.5)
eGFR: 94 mL/min/{1.73_m2} (ref >59)

## 2023-02-28 LAB — THYROID PANEL WITH TSH
Free Thyroxine Index: 1.9 (ref 1.2–4.9)
T3 Uptake Ratio: 26 % (ref 24–39)
T4, Total: 7.3 ug/dL (ref 4.5–12.0)
TSH: 2.06 u[IU]/mL (ref 0.450–4.500)

## 2023-02-28 LAB — CBC WITH DIFFERENTIAL/PLATELET
Basophils Absolute: 0 10*3/uL (ref 0.0–0.2)
Basos: 1 %
EOS (ABSOLUTE): 0.3 10*3/uL (ref 0.0–0.4)
Eos: 7 %
Hematocrit: 39.5 % (ref 34.0–46.6)
Hemoglobin: 12.9 g/dL (ref 11.1–15.9)
Immature Grans (Abs): 0 10*3/uL (ref 0.0–0.1)
Immature Granulocytes: 0 %
Lymphocytes Absolute: 1.5 10*3/uL (ref 0.7–3.1)
Lymphs: 32 %
MCH: 31 pg (ref 26.6–33.0)
MCHC: 32.7 g/dL (ref 31.5–35.7)
MCV: 95 fL (ref 79–97)
Monocytes Absolute: 0.4 10*3/uL (ref 0.1–0.9)
Monocytes: 9 %
Neutrophils Absolute: 2.4 10*3/uL (ref 1.4–7.0)
Neutrophils: 51 %
Platelets: 185 10*3/uL (ref 150–450)
RBC: 4.16 x10E6/uL (ref 3.77–5.28)
RDW: 11.6 % — ABNORMAL LOW (ref 11.7–15.4)
WBC: 4.6 10*3/uL (ref 3.4–10.8)

## 2023-02-28 LAB — LIPID PANEL
Chol/HDL Ratio: 2.4 ratio (ref 0.0–4.4)
Cholesterol, Total: 110 mg/dL (ref 100–199)
HDL: 46 mg/dL (ref >39)
LDL Chol Calc (NIH): 50 mg/dL (ref 0–99)
Triglycerides: 66 mg/dL (ref 0–149)
VLDL Cholesterol Cal: 14 mg/dL (ref 5–40)

## 2023-04-05 ENCOUNTER — Encounter: Payer: BC Managed Care – PPO | Admitting: Nurse Practitioner
